# Patient Record
Sex: Male | Born: 1960 | Hispanic: No | Marital: Married | Smoking: Former smoker
Health system: Southern US, Community
[De-identification: ages and names within clinical notes are randomized; demographics above are authoritative.]

## PROBLEM LIST (undated history)

## (undated) DIAGNOSIS — E7800 Pure hypercholesterolemia, unspecified: Secondary | ICD-10-CM

## (undated) DIAGNOSIS — I10 Essential (primary) hypertension: Secondary | ICD-10-CM

---

## 2009-12-07 ENCOUNTER — Encounter: Admission: RE | Admit: 2009-12-07 | Discharge: 2009-12-07 | Payer: Self-pay | Admitting: Family Medicine

## 2011-01-16 ENCOUNTER — Other Ambulatory Visit: Payer: Self-pay

## 2011-01-16 ENCOUNTER — Emergency Department (HOSPITAL_COMMUNITY): Payer: BC Managed Care – PPO

## 2011-01-16 ENCOUNTER — Emergency Department (HOSPITAL_COMMUNITY): Payer: BC Managed Care – PPO | Admitting: Certified Registered Nurse Anesthetist

## 2011-01-16 ENCOUNTER — Encounter (HOSPITAL_COMMUNITY): Payer: Self-pay | Admitting: Certified Registered Nurse Anesthetist

## 2011-01-16 ENCOUNTER — Encounter: Payer: Self-pay | Admitting: Neurology

## 2011-01-16 ENCOUNTER — Encounter (HOSPITAL_COMMUNITY)
Admission: EM | Disposition: A | Discharge status: Home / Self Care | Payer: Self-pay | Source: Ambulatory Visit | Attending: Orthopedic Surgery

## 2011-01-16 ENCOUNTER — Inpatient Hospital Stay (HOSPITAL_COMMUNITY)
Admission: EM | Admit: 2011-01-16 | Discharge: 2011-01-20 | DRG: 219 | Disposition: A | Discharge status: Home / Self Care | Payer: BC Managed Care – PPO | Source: Ambulatory Visit | Attending: Orthopedic Surgery | Admitting: Orthopedic Surgery

## 2011-01-16 DIAGNOSIS — I10 Essential (primary) hypertension: Secondary | ICD-10-CM | POA: Diagnosis present

## 2011-01-16 DIAGNOSIS — Y990 Civilian activity done for income or pay: Secondary | ICD-10-CM

## 2011-01-16 DIAGNOSIS — E7800 Pure hypercholesterolemia, unspecified: Secondary | ICD-10-CM | POA: Diagnosis present

## 2011-01-16 DIAGNOSIS — S82843B Displaced bimalleolar fracture of unspecified lower leg, initial encounter for open fracture type I or II: Principal | ICD-10-CM | POA: Diagnosis present

## 2011-01-16 DIAGNOSIS — K219 Gastro-esophageal reflux disease without esophagitis: Secondary | ICD-10-CM | POA: Diagnosis present

## 2011-01-16 DIAGNOSIS — S82209B Unspecified fracture of shaft of unspecified tibia, initial encounter for open fracture type I or II: Secondary | ICD-10-CM | POA: Diagnosis present

## 2011-01-16 DIAGNOSIS — W1789XA Other fall from one level to another, initial encounter: Secondary | ICD-10-CM | POA: Diagnosis present

## 2011-01-16 HISTORY — DX: Essential (primary) hypertension: I10

## 2011-01-16 HISTORY — PX: ORIF ANKLE FRACTURE: SHX5408

## 2011-01-16 HISTORY — DX: Pure hypercholesterolemia, unspecified: E78.00

## 2011-01-16 HISTORY — PX: FRACTURE SURGERY: SHX138

## 2011-01-16 LAB — PROTIME-INR
INR: 0.97 (ref 0.00–1.49)
Prothrombin Time: 13.1 seconds (ref 11.6–15.2)

## 2011-01-16 LAB — DIFFERENTIAL
Basophils Absolute: 0.1 10*3/uL (ref 0.0–0.1)
Lymphocytes Relative: 30 % (ref 12–46)
Lymphs Abs: 3.8 10*3/uL (ref 0.7–4.0)
Neutro Abs: 8 10*3/uL — ABNORMAL HIGH (ref 1.7–7.7)

## 2011-01-16 LAB — CBC
Platelets: 210 10*3/uL (ref 150–400)
RBC: 5.17 MIL/uL (ref 4.22–5.81)
RDW: 13.3 % (ref 11.5–15.5)
WBC: 12.8 10*3/uL — ABNORMAL HIGH (ref 4.0–10.5)

## 2011-01-16 LAB — BASIC METABOLIC PANEL
CO2: 19 mEq/L (ref 19–32)
Chloride: 100 mEq/L (ref 96–112)
Potassium: 3.3 mEq/L — ABNORMAL LOW (ref 3.5–5.1)
Sodium: 136 mEq/L (ref 135–145)

## 2011-01-16 SURGERY — OPEN REDUCTION INTERNAL FIXATION (ORIF) ANKLE FRACTURE
Anesthesia: General | Laterality: Right | Wound class: Contaminated

## 2011-01-16 MED ORDER — SODIUM CHLORIDE 0.9 % IV SOLN
INTRAVENOUS | Status: DC | PRN
  Administered 2011-01-16: 12:00:00 via INTRAVENOUS

## 2011-01-16 MED ORDER — DIPHENHYDRAMINE HCL 50 MG/ML IJ SOLN
12.5000 mg | Freq: Four times a day (QID) | INTRAMUSCULAR | Status: DC | PRN
  Administered 2011-01-17: 12.5 mg via INTRAVENOUS
  Filled 2011-01-16: qty 1

## 2011-01-16 MED ORDER — PROPOFOL 10 MG/ML IV EMUL
INTRAVENOUS | Status: DC | PRN
  Administered 2011-01-16: 280 mg via INTRAVENOUS

## 2011-01-16 MED ORDER — METHOCARBAMOL 100 MG/ML IJ SOLN: 500.0000 mg | Freq: Four times a day (QID) | INTRAMUSCULAR | Status: DC | PRN

## 2011-01-16 MED ORDER — MORPHINE SULFATE 10 MG/ML IJ SOLN
INTRAMUSCULAR | Status: DC | PRN
  Administered 2011-01-16 (×2): 2 mg via INTRAVENOUS
  Administered 2011-01-16 (×2): 3 mg via INTRAVENOUS

## 2011-01-16 MED ORDER — MIDAZOLAM HCL 5 MG/5ML IJ SOLN
INTRAMUSCULAR | Status: DC | PRN
  Administered 2011-01-16: 2 mg via INTRAVENOUS

## 2011-01-16 MED ORDER — SODIUM CHLORIDE 0.9 % IR SOLN
Status: DC | PRN
  Administered 2011-01-16: 3000 mL

## 2011-01-16 MED ORDER — WARFARIN VIDEO: Freq: Once | Status: DC

## 2011-01-16 MED ORDER — FENTANYL CITRATE 0.05 MG/ML IJ SOLN
INTRAMUSCULAR | Status: DC | PRN
  Administered 2011-01-16 (×2): 50 ug via INTRAVENOUS
  Administered 2011-01-16: 100 ug via INTRAVENOUS
  Administered 2011-01-16: 50 ug via INTRAVENOUS
  Administered 2011-01-16: 100 ug via INTRAVENOUS
  Administered 2011-01-16: 50 ug via INTRAVENOUS
  Administered 2011-01-16: 100 ug via INTRAVENOUS

## 2011-01-16 MED ORDER — LACTATED RINGERS IV SOLN
INTRAVENOUS | Status: DC | PRN
  Administered 2011-01-16 (×3): via INTRAVENOUS

## 2011-01-16 MED ORDER — VECURONIUM BROMIDE 10 MG IV SOLR
INTRAVENOUS | Status: DC | PRN
  Administered 2011-01-16 (×2): 2 mg via INTRAVENOUS

## 2011-01-16 MED ORDER — DIPHENHYDRAMINE HCL 12.5 MG/5ML PO ELIX
12.5000 mg | ORAL_SOLUTION | Freq: Four times a day (QID) | ORAL | Status: DC | PRN
  Filled 2011-01-16: qty 5

## 2011-01-16 MED ORDER — GLYCOPYRROLATE 0.2 MG/ML IJ SOLN
INTRAMUSCULAR | Status: DC | PRN
  Administered 2011-01-16: .4 mg via INTRAVENOUS

## 2011-01-16 MED ORDER — DOCUSATE SODIUM 100 MG PO CAPS
100.0000 mg | ORAL_CAPSULE | Freq: Two times a day (BID) | ORAL | Status: DC
  Administered 2011-01-16 – 2011-01-20 (×8): 100 mg via ORAL
  Filled 2011-01-16 (×11): qty 1

## 2011-01-16 MED ORDER — PATIENT'S GUIDE TO USING COUMADIN BOOK
Freq: Once | Status: AC
  Administered 2011-01-16: 18:00:00
  Filled 2011-01-16: qty 1

## 2011-01-16 MED ORDER — ONDANSETRON HCL 4 MG/2ML IJ SOLN
4.0000 mg | Freq: Four times a day (QID) | INTRAMUSCULAR | Status: DC | PRN
  Filled 2011-01-16 (×3): qty 2

## 2011-01-16 MED ORDER — ONDANSETRON HCL 4 MG/2ML IJ SOLN
4.0000 mg | Freq: Four times a day (QID) | INTRAMUSCULAR | Status: DC | PRN
  Administered 2011-01-16 – 2011-01-18 (×4): 4 mg via INTRAVENOUS
  Filled 2011-01-16: qty 2

## 2011-01-16 MED ORDER — MAGNESIUM CITRATE PO SOLN
1.0000 | Freq: Once | ORAL | Status: AC | PRN
  Filled 2011-01-16: qty 296

## 2011-01-16 MED ORDER — SENNOSIDES-DOCUSATE SODIUM 8.6-50 MG PO TABS
1.0000 | ORAL_TABLET | Freq: Every evening | ORAL | Status: DC | PRN
  Administered 2011-01-19: 1 via ORAL
  Filled 2011-01-16: qty 1

## 2011-01-16 MED ORDER — ROCURONIUM BROMIDE 100 MG/10ML IV SOLN
INTRAVENOUS | Status: DC | PRN
  Administered 2011-01-16: 50 mg via INTRAVENOUS

## 2011-01-16 MED ORDER — ACETAMINOPHEN 10 MG/ML IV SOLN
INTRAVENOUS | Status: DC | PRN
  Administered 2011-01-16: 1000 mg via INTRAVENOUS

## 2011-01-16 MED ORDER — CEFAZOLIN SODIUM 1-5 GM-% IV SOLN
1.0000 g | Freq: Once | INTRAVENOUS | Status: AC
  Administered 2011-01-16 (×2): 1 g via INTRAVENOUS
  Filled 2011-01-16: qty 50

## 2011-01-16 MED ORDER — METOCLOPRAMIDE HCL 10 MG PO TABS: 5.0000 mg | ORAL_TABLET | Freq: Three times a day (TID) | ORAL | Status: DC | PRN

## 2011-01-16 MED ORDER — SODIUM CHLORIDE 0.9 % IV SOLN
INTRAVENOUS | Status: DC
  Administered 2011-01-17: 1000 mL via INTRAVENOUS
  Administered 2011-01-17: 20 mL/h via INTRAVENOUS

## 2011-01-16 MED ORDER — NEOSTIGMINE METHYLSULFATE 1 MG/ML IJ SOLN
INTRAMUSCULAR | Status: DC | PRN
  Administered 2011-01-16: 3 mg via INTRAVENOUS

## 2011-01-16 MED ORDER — SODIUM CHLORIDE 0.9 % IV SOLN
INTRAVENOUS | Status: DC
  Administered 2011-01-16: 12:00:00 via INTRAVENOUS
  Administered 2011-01-16: 1000 mL via INTRAVENOUS

## 2011-01-16 MED ORDER — ONDANSETRON HCL 4 MG/2ML IJ SOLN
INTRAMUSCULAR | Status: DC | PRN
  Administered 2011-01-16: 4 mg via INTRAVENOUS

## 2011-01-16 MED ORDER — ONDANSETRON HCL 4 MG/2ML IJ SOLN: 4.0000 mg | Freq: Once | INTRAMUSCULAR | Status: DC | PRN

## 2011-01-16 MED ORDER — HYDROMORPHONE HCL PF 1 MG/ML IJ SOLN
0.2500 mg | INTRAMUSCULAR | Status: DC | PRN
  Administered 2011-01-16 (×2): 0.5 mg via INTRAVENOUS

## 2011-01-16 MED ORDER — LIDOCAINE HCL (CARDIAC) 20 MG/ML IV SOLN
INTRAVENOUS | Status: DC | PRN
  Administered 2011-01-16: 100 mg via INTRAVENOUS

## 2011-01-16 MED ORDER — HYDROMORPHONE 0.3 MG/ML IV SOLN
INTRAVENOUS | Status: DC
  Administered 2011-01-16: 4.5 mg via INTRAVENOUS
  Administered 2011-01-17: 02:00:00 via INTRAVENOUS
  Administered 2011-01-17: 6.51 mg via INTRAVENOUS
  Filled 2011-01-16 (×2): qty 25

## 2011-01-16 MED ORDER — SIMVASTATIN 40 MG PO TABS
40.0000 mg | ORAL_TABLET | Freq: Every day | ORAL | Status: DC
  Administered 2011-01-17 – 2011-01-19 (×3): 40 mg via ORAL
  Filled 2011-01-16 (×4): qty 1

## 2011-01-16 MED ORDER — TETANUS-DIPHTH-ACELL PERTUSSIS 5-2.5-18.5 LF-MCG/0.5 IM SUSP
0.5000 mL | Freq: Once | INTRAMUSCULAR | Status: AC
  Administered 2011-01-16: 0.5 mL via INTRAMUSCULAR
  Filled 2011-01-16 (×2): qty 0.5

## 2011-01-16 MED ORDER — ONDANSETRON HCL 4 MG PO TABS: 4.0000 mg | ORAL_TABLET | Freq: Four times a day (QID) | ORAL | Status: DC | PRN

## 2011-01-16 MED ORDER — HYDROCODONE-ACETAMINOPHEN 5-325 MG PO TABS
1.0000 | ORAL_TABLET | ORAL | Status: DC | PRN
  Administered 2011-01-17 – 2011-01-18 (×3): 2 via ORAL
  Filled 2011-01-16 (×3): qty 2

## 2011-01-16 MED ORDER — LACTATED RINGERS IV SOLN
INTRAVENOUS | Status: DC | PRN
  Administered 2011-01-16: 14:00:00 via INTRAVENOUS

## 2011-01-16 MED ORDER — OXYCODONE-ACETAMINOPHEN 5-325 MG PO TABS
1.0000 | ORAL_TABLET | ORAL | Status: DC | PRN
  Administered 2011-01-17 – 2011-01-20 (×11): 2 via ORAL
  Filled 2011-01-16 (×10): qty 2
  Filled 2011-01-16: qty 1
  Filled 2011-01-16 (×2): qty 2

## 2011-01-16 MED ORDER — SODIUM CHLORIDE 0.9 % IJ SOLN: 9.0000 mL | INTRAMUSCULAR | Status: DC | PRN

## 2011-01-16 MED ORDER — NALOXONE HCL 0.4 MG/ML IJ SOLN: 0.4000 mg | INTRAMUSCULAR | Status: DC | PRN

## 2011-01-16 MED ORDER — PANTOPRAZOLE SODIUM 20 MG PO TBEC
20.0000 mg | DELAYED_RELEASE_TABLET | Freq: Every day | ORAL | Status: DC
  Administered 2011-01-16 – 2011-01-20 (×5): 20 mg via ORAL
  Filled 2011-01-16 (×5): qty 1

## 2011-01-16 MED ORDER — GENTAMICIN SULFATE 40 MG/ML IJ SOLN
120.0000 mg | INTRAVENOUS | Status: DC
  Filled 2011-01-16: qty 3

## 2011-01-16 MED ORDER — BISACODYL 5 MG PO TBEC: 5.0000 mg | DELAYED_RELEASE_TABLET | Freq: Every day | ORAL | Status: DC | PRN

## 2011-01-16 MED ORDER — DIPHENHYDRAMINE HCL 12.5 MG/5ML PO ELIX
12.5000 mg | ORAL_SOLUTION | ORAL | Status: DC | PRN
  Filled 2011-01-16: qty 10

## 2011-01-16 MED ORDER — CEFAZOLIN SODIUM 1-5 GM-% IV SOLN
1.0000 g | Freq: Four times a day (QID) | INTRAVENOUS | Status: AC
  Administered 2011-01-16 – 2011-01-18 (×9): 1 g via INTRAVENOUS
  Filled 2011-01-16 (×9): qty 50

## 2011-01-16 MED ORDER — LABETALOL HCL 5 MG/ML IV SOLN
INTRAVENOUS | Status: DC | PRN
  Administered 2011-01-16: 5 mg via INTRAVENOUS

## 2011-01-16 MED ORDER — METOCLOPRAMIDE HCL 5 MG/ML IJ SOLN
5.0000 mg | Freq: Three times a day (TID) | INTRAMUSCULAR | Status: DC | PRN
  Filled 2011-01-16: qty 2

## 2011-01-16 MED ORDER — GENTAMICIN IN SALINE 1.6-0.9 MG/ML-% IV SOLN
INTRAVENOUS | Status: DC | PRN
  Administered 2011-01-16: 120 mg via INTRAVENOUS

## 2011-01-16 MED ORDER — HYDROMORPHONE HCL PF 1 MG/ML IJ SOLN
1.0000 mg | Freq: Once | INTRAMUSCULAR | Status: AC
  Administered 2011-01-16: 1 mg via INTRAVENOUS
  Filled 2011-01-16: qty 1

## 2011-01-16 MED ORDER — METHOCARBAMOL 500 MG PO TABS
500.0000 mg | ORAL_TABLET | Freq: Four times a day (QID) | ORAL | Status: DC | PRN
  Administered 2011-01-17 – 2011-01-19 (×7): 500 mg via ORAL
  Filled 2011-01-16 (×8): qty 1

## 2011-01-16 MED ORDER — WARFARIN SODIUM 10 MG PO TABS
10.0000 mg | ORAL_TABLET | Freq: Once | ORAL | Status: AC
  Administered 2011-01-16: 10 mg via ORAL
  Filled 2011-01-16: qty 1

## 2011-01-16 SURGICAL SUPPLY — 73 items
BANDAGE ELASTIC 6 VELCRO ST LF (GAUZE/BANDAGES/DRESSINGS) ×2 IMPLANT
BANDAGE ESMARK 6X9 LF (GAUZE/BANDAGES/DRESSINGS) IMPLANT
BANDAGE GAUZE ELAST BULKY 4 IN (GAUZE/BANDAGES/DRESSINGS) ×2 IMPLANT
BIT DRILL 2.5X2.75 QC CALB (BIT) ×2 IMPLANT
BIT DRILL CALIBRATED 2.7 (BIT) ×2 IMPLANT
BLADE SURG 10 STRL SS (BLADE) ×2 IMPLANT
BNDG COHESIVE 4X5 TAN STRL (GAUZE/BANDAGES/DRESSINGS) ×2 IMPLANT
BNDG COHESIVE 6X5 TAN STRL LF (GAUZE/BANDAGES/DRESSINGS) ×2 IMPLANT
BNDG ESMARK 6X9 LF (GAUZE/BANDAGES/DRESSINGS)
BNDG GAUZE STRTCH 6 (GAUZE/BANDAGES/DRESSINGS) ×2 IMPLANT
CANNISTER 500ML ×2 IMPLANT
CLOTH BEACON ORANGE TIMEOUT ST (SAFETY) ×2 IMPLANT
COVER SURGICAL LIGHT HANDLE (MISCELLANEOUS) ×4 IMPLANT
CUFF TOURNIQUET SINGLE 34IN LL (TOURNIQUET CUFF) IMPLANT
CUFF TOURNIQUET SINGLE 44IN (TOURNIQUET CUFF) IMPLANT
DRAPE C-ARM MINI 42X72 WSTRAPS (DRAPES) ×2 IMPLANT
DRAPE INCISE IOBAN 66X45 STRL (DRAPES) ×2 IMPLANT
DRAPE PROXIMA HALF (DRAPES) ×2 IMPLANT
DRAPE U-SHAPE 47X51 STRL (DRAPES) ×2 IMPLANT
DRSG ADAPTIC 3X8 NADH LF (GAUZE/BANDAGES/DRESSINGS) ×2 IMPLANT
DRSG VAC ATS SM SENSATRAC (GAUZE/BANDAGES/DRESSINGS) ×2 IMPLANT
DURAPREP 26ML APPLICATOR (WOUND CARE) ×2 IMPLANT
ELECT REM PT RETURN 9FT ADLT (ELECTROSURGICAL) ×2
ELECTRODE REM PT RTRN 9FT ADLT (ELECTROSURGICAL) ×1 IMPLANT
GLOVE BIOGEL PI IND STRL 9 (GLOVE) ×1 IMPLANT
GLOVE BIOGEL PI INDICATOR 9 (GLOVE) ×1
GLOVE SURG ORTHO 9.0 STRL STRW (GLOVE) ×2 IMPLANT
GOWN PREVENTION PLUS XLARGE (GOWN DISPOSABLE) ×2 IMPLANT
GOWN SRG XL XLNG 56XLVL 4 (GOWN DISPOSABLE) ×2 IMPLANT
GOWN STRL NON-REIN XL XLG LVL4 (GOWN DISPOSABLE) ×2
HANDPIECE INTERPULSE COAX TIP (DISPOSABLE) ×1
K-WIRE ACE 1.6X6 (WIRE) ×4
KIT BASIN OR (CUSTOM PROCEDURE TRAY) ×2 IMPLANT
KIT ROOM TURNOVER OR (KITS) ×2 IMPLANT
KWIRE ACE 1.6X6 (WIRE) ×2 IMPLANT
MANIFOLD NEPTUNE II (INSTRUMENTS) ×2 IMPLANT
NS IRRIG 1000ML POUR BTL (IV SOLUTION) ×2 IMPLANT
PACK ORTHO EXTREMITY (CUSTOM PROCEDURE TRAY) ×2 IMPLANT
PAD ARMBOARD 7.5X6 YLW CONV (MISCELLANEOUS) ×4 IMPLANT
PAD CAST 4YDX4 CTTN HI CHSV (CAST SUPPLIES) ×1 IMPLANT
PADDING CAST COTTON 4X4 STRL (CAST SUPPLIES) ×1
PADDING CAST COTTON 6X4 STRL (CAST SUPPLIES) ×2 IMPLANT
PENCIL BUTTON HOLSTER BLD 10FT (ELECTRODE) ×2 IMPLANT
PLATE 6H RT DIST ANTLAT TIB (Plate) ×1 IMPLANT
PLATE ACE 100DEG 8HOLE (Plate) ×2 IMPLANT
PLATE ANTLAT CNTR NAR 114X6 (Plate) ×1 IMPLANT
SCREW CORT 3.5X26 (Screw) ×1 IMPLANT
SCREW CORT FT 32X3.5XNONLOCK (Screw) ×1 IMPLANT
SCREW CORT T15 26X3.5XST LCK (Screw) ×1 IMPLANT
SCREW CORTICAL 3.5MM  12MM (Screw) ×4 IMPLANT
SCREW CORTICAL 3.5MM  28MM (Screw) ×1 IMPLANT
SCREW CORTICAL 3.5MM  30MM (Screw) ×1 IMPLANT
SCREW CORTICAL 3.5MM  32MM (Screw) ×1 IMPLANT
SCREW CORTICAL 3.5MM 12MM (Screw) ×4 IMPLANT
SCREW CORTICAL 3.5MM 14MM (Screw) ×4 IMPLANT
SCREW CORTICAL 3.5MM 28MM (Screw) ×1 IMPLANT
SCREW CORTICAL 3.5MM 30MM (Screw) ×1 IMPLANT
SCREW LOCK CORT STAR 3.5X38 (Screw) ×2 IMPLANT
SCREW LOCK CORT STAR 3.5X42 (Screw) ×6 IMPLANT
SET HNDPC FAN SPRY TIP SCT (DISPOSABLE) ×1 IMPLANT
SPONGE GAUZE 4X4 12PLY (GAUZE/BANDAGES/DRESSINGS) ×2 IMPLANT
SPONGE LAP 18X18 X RAY DECT (DISPOSABLE) ×2 IMPLANT
STAPLER VISISTAT 35W (STAPLE) ×2 IMPLANT
STOCKINETTE IMPERVIOUS LG (DRAPES) ×2 IMPLANT
SUCTION FRAZIER TIP 10 FR DISP (SUCTIONS) ×2 IMPLANT
SUT ETHILON 2 0 PSLX (SUTURE) IMPLANT
SUT VIC AB 0 CTB1 27 (SUTURE) ×2 IMPLANT
SUT VIC AB 2-0 CTB1 (SUTURE) ×4 IMPLANT
SYR BULB IRRIGATION 50ML (SYRINGE) ×2 IMPLANT
TOWEL OR 17X24 6PK STRL BLUE (TOWEL DISPOSABLE) ×2 IMPLANT
TOWEL OR 17X26 10 PK STRL BLUE (TOWEL DISPOSABLE) ×2 IMPLANT
TUBE CONNECTING 12X1/4 (SUCTIONS) ×2 IMPLANT
WATER STERILE IRR 1000ML POUR (IV SOLUTION) ×2 IMPLANT

## 2011-01-16 NOTE — ED Notes (Signed)
All pt belongings placed in bag. Some money and coins fell out of pocket, placed in blue cup put in pt belonging bag

## 2011-01-16 NOTE — Transfer of Care (Addendum)
Immediate Anesthesia Transfer of Care Note  Patient: Xavier [name]  Procedure(s) Performed:  OPEN REDUCTION INTERNAL FIXATION (ORIF) ANKLE FRACTURE - open reduction and internal fixation tib/fib fracture  Patient Location: PACU  Anesthesia Type: General  Level of Consciousness: awake  Airway & Oxygen Therapy: Patient Spontanous Breathing and Patient connected to nasal cannula oxygen  Post-op Assessment: Report given to PACU RN and Post -op Vital signs reviewed and stable  Post vital signs: stable  Complications: No apparent anesthesia complications

## 2011-01-16 NOTE — Addendum Note (Signed)
Addendum  created 01/16/11 1549 by Rivka Barbara, MD   Modules edited:Anesthesia Events

## 2011-01-16 NOTE — Anesthesia Postprocedure Evaluation (Signed)
  Anesthesia Post-op Note  Patient: Xavier [name]  Procedure(s) Performed:  OPEN REDUCTION INTERNAL FIXATION (ORIF) ANKLE FRACTURE - open reduction and internal fixation tib/fib fracture  Patient Location: PACU  Anesthesia Type: General  Level of Consciousness: awake, sedated and patient cooperative  Airway and Oxygen Therapy: Patient Spontanous Breathing and Patient connected to nasal cannula oxygen  Post-op Pain: mild  Post-op Assessment: Post-op Vital signs reviewed, Patient's Cardiovascular Status Stable, PATIENT'S CARDIOVASCULAR STATUS UNSTABLE, Patent Airway, No signs of Nausea or vomiting and Pain level controlled  Post-op Vital Signs: stable  Complications: No apparent anesthesia complications

## 2011-01-16 NOTE — ED Notes (Signed)
PER PTAR-Pt was standing on ladder on 2 step, fell, got right foot caught in ladder. Right leg wound is open, leg stabilized with splint. Pt fully immobilized even though pt not c/o neck or back pain. No LOC. Pain 10/10. Bandage applied. R. Pedal pulse strong, able to wiggle toes. 116/80, 68, 20 RR, 100 RA.

## 2011-01-16 NOTE — Anesthesia Preprocedure Evaluation (Addendum)
Anesthesia Evaluation  Patient identified by MRN, date of birth, ID band Patient awake    Reviewed: Allergy & Precautions, H&P , NPO status , Patient's Chart, lab work & pertinent test results  Airway Mallampati: II TM Distance: >3 FB Neck ROM: Limited   Comment: C-spine not cleared patient in cervical collar Dental  (+) Teeth Intact and Dental Advisory Given   Pulmonary neg pulmonary ROS,    Pulmonary exam normal       Cardiovascular hypertension,     Neuro/Psych Negative Neurological ROS     GI/Hepatic Neg liver ROS, GERD-  Medicated and Controlled,  Endo/Other  Negative Endocrine ROS  Renal/GU negative Renal ROS  Genitourinary negative   Musculoskeletal   Abdominal Normal abdominal exam  (+)   Peds  Hematology negative hematology ROS (+)   Anesthesia Other Findings   Reproductive/Obstetrics negative OB ROS                          Anesthesia Physical Anesthesia Plan  ASA: I  Anesthesia Plan: General and General ETT   Post-op Pain Management:    Induction: Intravenous  Airway Management Planned: Oral ETT  Additional Equipment:   Intra-op Plan:   Post-operative Plan: Extubation in OR  Informed Consent: I have reviewed the patients History and Physical, chart, labs and discussed the procedure including the risks, benefits and alternatives for the proposed anesthesia with the patient or authorized representative who has indicated his/her understanding and acceptance.     Plan Discussed with: Anesthesiologist, CRNA and Surgeon  Anesthesia Plan Comments:         Anesthesia Quick Evaluation

## 2011-01-16 NOTE — ED Notes (Signed)
OR called ready for patient. Dr. Lajoyce Corners doing surgery.

## 2011-01-16 NOTE — Op Note (Signed)
OPERATIVE REPORT  DATE OF SURGERY: 01/16/2011  PATIENT:  Xavier [name],  50 y.o. male  PRE-OPERATIVE DIAGNOSIS:  open tibia fibula fracture. Plafond fracture. Open traumatic wound.  POST-OPERATIVE DIAGNOSIS:  open tibia fibula fracture. Plafond fracture. Open traumatic wound.  PROCEDURE:  Procedure(s): #1 irrigation and debridement open traumatic wound medial malleolus. #2 open reduction internal fixation plafond fracture of the tibia. #3 open reduction internal fixation fibular fracture. #4 closure of traumatic wound. #5 application of wound VAC.   SURGEON:  Surgeon(s): Nadara Mustard, MD  ANESTHESIA:   general  EBL:  Minimal ML  SPECIMEN:  No Specimen  TOURNIQUET:  * Missing tourniquet times found for documented tourniquets in log:  [id number] * Esmarch at the ankle for approximately 45 minutes. PROCEDURE DETAILS: Patient is a 50 year old gentleman who fell approximately 2 feet off a ladder. He had his ankle catching Her and had a twisting fall. Patient denied any head trauma or any other injury other than his ankle. Patient's foot was neurovascularly intact gross sensation was intact he had good pulses and he presents at this time for surgical intervention. Risks and benefits were discussed including infection neurovascular injury persistent pain DVT arthritis need for additional surgery. Patient states he understands and wishes to proceed at this time. Patient was brought to the OR room 15 and underwent a general anesthetic. After adequate levels of anesthesia were obtained patient's right lower extremity was first prepped using Hibiclens scrub and then prepped using DuraPrep and draped into a sterile field. Attention was first focused on the open wound. Using a curette and pulsatile lavage the open wound was irrigated and cleansed. There is no gross foreign bodies within the wound. Attention was then focused anterior collateral. A anterior lateral incision was made in order to provide  fixation to the fibula and tibia. Due to the attending was skin over the medial malleolus it was not felt to be safe to proceed with placing a plate the medial aspect of the tibia. The anterior lateral incision was carried down to the tibia the tibia was reduced and stabilized with a Biomet anterior lateral plate. 3 distal locking screws were placed in for proximal compression screws were placed. C-arm fluoroscopy verified reduction. Attention was then focused on the fibula. There was significant comminution of the fibula the fibula was pulled out to length and stabilized with an 8 hole one third tubular plate with 3 screws proximally and 3 screws distally. C-arm fluoroscopy verified congruence of the mortise and verified reduction in both AP and lateral planes. The wounds were again irrigated with pulsatile lavage the skin over the traumatic wound was closed using a far near near far suture technique. The skin over the anterior lateral incision was closed using #0 intracuticular stitch 0 Vicryl and approximate staples. There was no tension on the medial traumatic wound and slight amount of tension on the lateral incision. It was elected to apply a wound VAC to help with the wound healing. Wound VAC was applied over both wounds. This was covered with Coban a good suction fit was obtained patient was extubated taken to the PACU in stable condition.  PLAN OF CARE: Admit to inpatient   PATIENT DISPOSITION:  PACU - hemodynamically stable.   Nadara Mustard, MD 01/16/2011 2:51 PM

## 2011-01-16 NOTE — Addendum Note (Signed)
Addendum  created 01/16/11 1619 by Rivka Barbara, MD   Modules edited:Anesthesia Events

## 2011-01-16 NOTE — ED Notes (Signed)
All belongings given to pt girlfriend

## 2011-01-16 NOTE — Addendum Note (Signed)
Addendum  created 01/16/11 1555 by Rivka Barbara, MD   Modules edited:Anesthesia Events, Notes Section

## 2011-01-16 NOTE — H&P (Signed)
Xavier [name] is an 50 y.o. male.   Chief Complaint: Right ankle pain with bone sticking out of the skin. 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 Patient states he only fell several feet but sustaining injury due to getting his ankles stuck in the rungs of the ladder. Patient denies any other trauma other than his ankle. Denies any head neck back pelvis or upper extremity pain.  Past Medical History  Diagnosis Date  . Hypertension   . Hypercholesteremia     History reviewed. No pertinent past surgical history.  No family history on file. Social History:  reports that he has never smoked. He has quit using smokeless tobacco. He reports that he drinks alcohol. He reports that he does not use illicit drugs.  Allergies: No Known Allergies  Medications Prior to Admission  Medication Dose Route Frequency Provider Last Rate Last Dose  . 0.9 %  sodium chloride infusion   Intravenous Continuous Celene Kras, MD 125 mL/hr at 01/16/11 1134    . ceFAZolin (ANCEF) IVPB 1 g/50 mL premix  1 g Intravenous Once Celene Kras, MD   1 g at 01/16/11 1140  . gentamicin (GARAMYCIN) 120 mg in dextrose 5 % 50 mL IVPB  120 mg Intravenous To OR Nadara Mustard, MD      . HYDROmorphone (DILAUDID) injection 1 mg  1 mg Intravenous Once Celene Kras, MD   1 mg at 01/16/11 1118  . HYDROmorphone (DILAUDID) injection 1 mg  1 mg Intravenous Once Celene Kras, MD   1 mg at 01/16/11 1133  . TDaP (BOOSTRIX) injection 0.5 mL  0.5 mL Intramuscular Once Celene Kras, MD   0.5 mL at 01/16/11 1135   No current outpatient prescriptions on file as of 01/16/2011.    Results for orders placed during the hospital encounter of 01/16/11 (from the past 48 hour(s))  CBC     Status: Abnormal   Collection Time   01/16/11 11:35 AM      Component Value Range Comment   WBC 12.8 (*) 4.0 -  10.5 (K/uL)    RBC 5.17  4.22 - 5.81 (MIL/uL)    Hemoglobin 15.9  13.0 - 17.0 (g/dL)    HCT 54.0  98.1 - 19.1 (%)    MCV 84.3  78.0 - 100.0 (fL)    MCH 30.8  26.0 - 34.0 (pg)    MCHC 36.5 (*) 30.0 - 36.0 (g/dL)    RDW 47.8  29.5 - 62.1 (%)    Platelets 210  150 - 400 (K/uL)   DIFFERENTIAL     Status: Abnormal   Collection Time   01/16/11 11:35 AM      Component Value Range Comment   Neutrophils Relative 62  43 - 77 (%)    Neutro Abs 8.0 (*) 1.7 - 7.7 (K/uL)    Lymphocytes Relative 30  12 - 46 (%)    Lymphs Abs 3.8  0.7 - 4.0 (K/uL)    Monocytes Relative 6  3 - 12 (%)    Monocytes Absolute 0.8  0.1 - 1.0 (K/uL)    Eosinophils Relative 2  0 - 5 (%)    Eosinophils Absolute 0.3  0.0 - 0.7 (K/uL)    Basophils Relative 0  0 - 1 (%)    Basophils  Absolute 0.1  0.0 - 0.1 (K/uL)   BASIC METABOLIC PANEL     Status: Abnormal   Collection Time   01/16/11 11:35 AM      Component Value Range Comment   Sodium 136  135 - 145 (mEq/L)    Potassium 3.3 (*) 3.5 - 5.1 (mEq/L)    Chloride 100  96 - 112 (mEq/L)    CO2 19  19 - 32 (mEq/L)    Glucose, Bld 151 (*) 70 - 99 (mg/dL)    BUN 11  6 - 23 (mg/dL)    Creatinine, Ser 4.09  0.50 - 1.35 (mg/dL)    Calcium 9.4  8.4 - 10.5 (mg/dL)    GFR calc non Af Amer >90  >90 (mL/min)    GFR calc Af Amer >90  >90 (mL/min)   PROTIME-INR     Status: Normal   Collection Time   01/16/11 11:35 AM      Component Value Range Comment   Prothrombin Time 13.1  11.6 - 15.2 (seconds)    INR 0.97  0.00 - 1.49     Dg Ankle Right Port  01/16/2011  *RADIOLOGY REPORT*  Clinical Data: Open ankle fracture.  PORTABLE RIGHT ANKLE - 2 VIEW  Comparison: None.  Findings: Markedly comminuted distal right tibial and fibular fractures.  The distal tibial shaft appears to disrupt the skin surface.  The tibial shaft is displaced 2.6 cm medially relative to the distal fragment.  IMPRESSION: Comminuted, displaced open distal tibial and fibular fractures.  Original Report Authenticated  By: Cyndie Chime, M.D.    Review of Systems  All other systems reviewed and are negative.    Blood pressure 144/75, pulse 69, temperature 97.7 F (36.5 C), temperature source Oral, resp. rate 19, height 6' (1.829 m), weight 95.255 kg (210 lb), SpO2 100.00%. Physical Exam  On examination patient has an open type IIIA distal tibia and fibular fracture right ankle. He has good dorsalis pedis pulse. His foot is neurovascularly intact and has gross sensation intact. Assessment/Plan Open type IIIA distal tibia and fibular fracture with a pilon fracture of the tibia and a segmental fracture of the fibula. Plan will plan for irrigation and debridement as well as open reduction internal fixation of the P1 fracture and open reduction internal fixation of the segmental fibular fracture. Risks and benefits were discussed including infection neurovascular injury pulmonary embolus DVT arthritis need for additional surgery patient states he understands and wishes to proceed at this time.  Akiera Allbaugh V 01/16/2011, 12:30 PM

## 2011-01-16 NOTE — Anesthesia Procedure Notes (Addendum)
Procedure Name: Intubation Date/Time: 01/16/2011 12:56 PM Performed by: Romie Minus Pre-anesthesia Checklist: Patient identified, Emergency Drugs available, Suction available and Patient being monitored Patient Re-evaluated:Patient Re-evaluated prior to inductionOxygen Delivery Method: Circle System Utilized Preoxygenation: Pre-oxygenation with 100% oxygen Intubation Type: IV induction Ventilation: Mask ventilation without difficulty and Oral airway inserted - appropriate to patient size Laryngoscope Size: Miller and 2 Grade View: Grade I Tube type: Oral Tube size: 7.5 mm Number of attempts: 1 Placement Confirmation: ETT inserted through vocal cords under direct vision,  positive ETCO2 and breath sounds checked- equal and bilateral Secured at: 23 cm Tube secured with: Tape Dental Injury: Teeth and Oropharynx as per pre-operative assessment

## 2011-01-16 NOTE — Progress Notes (Signed)
ANTICOAGULATION CONSULT NOTE - Initial Consult  Pharmacy Consult for warfarin Indication: VTE prophylaxis  No Known Allergies  Patient Measurements: Height: 6' (182.9 cm) Weight: 210 lb (95.255 kg) IBW/kg (Calculated) : 77.6   Vital Signs: Temp: 99 F (37.2 C) (12/24 1615) Temp src: Oral (12/24 1121) BP: 110/62 mmHg (12/24 1600) Pulse Rate: 79  (12/24 1615)  Labs:  Basename 01/16/11 1135  HGB 15.9  HCT 43.6  PLT 210  APTT --  LABPROT 13.1  INR 0.97  HEPARINUNFRC --  CREATININE 0.77  CKTOTAL --  CKMB --  TROPONINI --   Estimated Creatinine Clearance: 135.3 ml/min (by C-G formula based on Cr of 0.77).  Medical History: Past Medical History  Diagnosis Date  . Hypertension   . Hypercholesteremia     Medications:  Scheduled:    . ceFAZolin (ANCEF) IV  1 g Intravenous Once  . ceFAZolin (ANCEF) IV  1 g Intravenous Q6H  . docusate sodium  100 mg Oral BID  .  HYDROmorphone (DILAUDID) injection  1 mg Intravenous Once  .  HYDROmorphone (DILAUDID) injection  1 mg Intravenous Once  . HYDROmorphone PCA 0.3 mg/mL   Intravenous Q4H  . pantoprazole  20 mg Oral Q1200  . TDaP  0.5 mL Intramuscular Once  . 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 He is to be placed on warfarin for VTE prophylaxis s/p surgery. Baseline INR is 0.97 and CBC is WNL p/t surgery. Some blood loss is to be expected with next CBC reading. Goal of Therapy:  INR 2-3   Plan:  1) Warfarin 10 mg po x1 2) Daily INR 3) Coumadin education video/book  Katrinka Blazing [place] R 01/16/2011,4:53 PM

## 2011-01-16 NOTE — Preoperative (Addendum)
Beta Blockers   Reason not to administer Beta Blockers:Not Applicable 

## 2011-01-16 NOTE — Addendum Note (Signed)
Addendum  created 01/16/11 1549 by Louetta Hollingshead Edward Wanya Bangura, MD   Modules edited:Anesthesia Events    

## 2011-01-16 NOTE — ED Provider Notes (Addendum)
History     CSN: [id number]  Arrival date & time 01/16/11  1105   First MD Initiated Contact with Patient 01/16/11 1111      Chief Complaint  Patient presents with  . Leg Injury    (Consider location/radiation/quality/duration/timing/severity/associated sxs/prior treatment) HPI Patient presents to the emergency room after her a fall. Patient states she's maybe 2-3 feet on a ladder when he slipped and fell twisting his right ankle. Patient sustained an open wound with an obvious fracture of his right ankle. Patient was brought in by EMS after immobilization. Patient denies any loss of consciousness. He denies any neck back or abdominal pain. He's not had any chest pain or any trouble breathing. Patient states his right leg feels a little bit numb but he can still feel me touching him and he tends to wiggle his toes. The pain is very severe and it increases with movement. Past Medical History  Diagnosis Date  . Hypertension   . Hypercholesteremia     History reviewed. No pertinent past surgical history.  No family history on file.  History  Substance Use Topics  . Smoking status: Never Smoker   . Smokeless tobacco: Not on file  . Alcohol Use: Yes      Review of Systems  All other systems reviewed and are negative.    Allergies  Review of patient's allergies indicates no known allergies.  Home Medications  No current outpatient prescriptions on file.  BP 144/75  Pulse 69  Temp(Src) 97.7 F (36.5 C) (Oral)  Resp 19  Ht 6' (1.829 m)  Wt 210 lb (95.255 kg)  BMI 28.48 kg/m2  SpO2 100%  Physical Exam  Nursing note and vitals reviewed. Constitutional: He appears well-developed and well-nourished. No distress.  HENT:  Head: Normocephalic and atraumatic.  Right Ear: External ear normal.  Left Ear: External ear normal.  Eyes: Conjunctivae are normal. Right eye exhibits no discharge. Left eye exhibits no discharge. No scleral icterus.  Neck: Neck supple. No  tracheal deviation present.  Cardiovascular: Normal rate, regular rhythm and intact distal pulses.   Pulmonary/Chest: Effort normal and breath sounds normal. No stridor. No respiratory distress. He has no wheezes. He has no rales.  Abdominal: Soft. Bowel sounds are normal. He exhibits no distension. There is no tenderness. There is no rebound and no guarding.  Musculoskeletal: He exhibits tenderness. He exhibits no edema.       Entire spine nontender, no tenderness knee, open wounds medial aspect right ankle with possibly 1 inch portion of bone visible, and dorsalis pedis pulse strong, cap refill brisk  Neurological: He is alert. He has normal strength. No sensory deficit. Cranial nerve deficit:  no gross defecits noted. He exhibits normal muscle tone. He displays no seizure activity. Coordination normal.  Skin: Skin is warm and dry. No rash noted.  Psychiatric: He has a normal mood and affect.    ED Course  Procedures (including critical care time)  Date: 01/16/2011  Rate: 67  Rhythm: normal sinus rhythm  QRS Axis: normal  Intervals: normal  ST/T Wave abnormalities: normal  Conduction Disutrbances: Incomplete right bundle branch block  Narrative Interpretation:   Old EKG Reviewed: none available  Medications  ceFAZolin (ANCEF) IVPB 1 g/50 mL premix (1 g Intravenous Given 01/16/11 1140)  0.9 %  sodium chloride infusion (  Intravenous New Bag/Given 01/16/11 1134)  simvastatin (ZOCOR) 40 MG tablet (not administered)  lansoprazole (PREVACID) 30 MG capsule (not administered)  HYDROmorphone (DILAUDID) injection 1 mg (1  mg Intravenous Given 01/16/11 1118)  HYDROmorphone (DILAUDID) injection 1 mg (1 mg Intravenous Given 01/16/11 1133)  TDaP (BOOSTRIX) injection 0.5 mL (0.5 mL Intramuscular Given 01/16/11 1135)     Labs Reviewed  CBC - Abnormal; Notable for the following:    WBC 12.8 (*)    MCHC 36.5 (*)    All other components within normal limits  DIFFERENTIAL - Abnormal; Notable for  the following:    Neutro Abs 8.0 (*)    All other components within normal limits  PROTIME-INR  BASIC METABOLIC PANEL   Dg Ankle Right Port  01/16/2011  *RADIOLOGY REPORT*  Clinical Data: Open ankle fracture.  PORTABLE RIGHT ANKLE - 2 VIEW  Comparison: None.  Findings: Markedly comminuted distal right tibial and fibular fractures.  The distal tibial shaft appears to disrupt the skin surface.  The tibial shaft is displaced 2.6 cm medially relative to the distal fragment.  IMPRESSION: Comminuted, displaced open distal tibial and fibular fractures.  Original Report Authenticated By: Cyndie Chime, M.D.     Dx: Open distal tibia, fibula fracture   MDM  Pt has an open tib fib fracture.  Abx and tetanus have been administered.  I spoke with Dr Lajoyce Corners and he will be taken to the OR.         Celene Kras, MD 01/16/11 1206  Celene Kras, MD 01/16/11 [phone]

## 2011-01-16 NOTE — ED Notes (Signed)
Pt transferred to  OR.

## 2011-01-16 NOTE — ED Notes (Signed)
Flow called, notified pt will need medsurg bed.

## 2011-01-17 LAB — BASIC METABOLIC PANEL
Chloride: 100 mEq/L (ref 96–112)
Creatinine, Ser: 0.78 mg/dL (ref 0.50–1.35)
GFR calc Af Amer: >90 mL/min (ref >90)
GFR calc non Af Amer: >90 mL/min (ref >90)
Potassium: 3.7 mEq/L (ref 3.5–5.1)

## 2011-01-17 LAB — PROTIME-INR
INR: 1.07 (ref 0.00–1.49)
Prothrombin Time: 14.1 seconds (ref 11.6–15.2)

## 2011-01-17 MED ORDER — WARFARIN SODIUM 10 MG PO TABS
10.0000 mg | ORAL_TABLET | Freq: Once | ORAL | Status: AC
  Administered 2011-01-17: 10 mg via ORAL
  Filled 2011-01-17: qty 1

## 2011-01-17 MED ORDER — ZOLPIDEM TARTRATE 10 MG PO TABS
10.0000 mg | ORAL_TABLET | Freq: Every evening | ORAL | Status: DC | PRN
  Administered 2011-01-17 – 2011-01-19 (×3): 10 mg via ORAL
  Filled 2011-01-17 (×3): qty 1

## 2011-01-17 MED ORDER — MORPHINE SULFATE 2 MG/ML IJ SOLN
2.0000 mg | INTRAMUSCULAR | Status: DC | PRN
  Administered 2011-01-17 – 2011-01-18 (×6): 2 mg via INTRAVENOUS
  Filled 2011-01-17 (×6): qty 1

## 2011-01-17 NOTE — Progress Notes (Signed)
ANTICOAGULATION CONSULT NOTE - Follow up Consult  Pharmacy Consult for warfarin Indication: VTE prophylaxis  No Known Allergies  Patient Measurements: Height: 6' (182.9 cm) Weight: 210 lb (95.255 kg) IBW/kg (Calculated) : 77.6   Vital Signs: Temp: 98.4 F (36.9 C) (12/25 0821) Temp src: Axillary (12/25 0821) BP: 127/80 mmHg (12/25 0821) Pulse Rate: 83  (12/25 0821)  Labs:  Basename 01/17/11 0540 01/16/11 1135  HGB -- 15.9  HCT -- 43.6  PLT -- 210  APTT -- --  LABPROT 14.1 13.1  INR 1.07 0.97  HEPARINUNFRC -- --  CREATININE 0.78 0.77  CKTOTAL -- --  CKMB -- --  TROPONINI -- --   Estimated Creatinine Clearance: 135.3 ml/min (by C-G formula based on Cr of 0.78).  Medical History: Past Medical History  Diagnosis Date  . Hypertension   . Hypercholesteremia     Medications:  Scheduled:     . ceFAZolin (ANCEF) IV  1 g Intravenous Once  . ceFAZolin (ANCEF) IV  1 g Intravenous Q6H  . docusate sodium  100 mg Oral BID  .  HYDROmorphone (DILAUDID) injection  1 mg Intravenous Once  .  HYDROmorphone (DILAUDID) injection  1 mg Intravenous Once  . pantoprazole  20 mg Oral Q1200  . patient's guide to using coumadin book   Does not apply Once  . simvastatin  40 mg Oral QHS  . TDaP  0.5 mL Intramuscular Once  . warfarin  10 mg Oral ONCE-1800  . warfarin   Does not apply Once  . DISCONTD: gentamicin  120 mg Intravenous To OR  . DISCONTD: HYDROmorphone PCA 0.3 mg/mL   Intravenous Q4H    Pharmacist System-Based Medication Review: Anticoagulation: Coumadin, INR subtherapeutic today, received 10 mg x1 yesterday Infectious Disease: Afeb, wbc 12.8 yesterday, no Cx, on post-op cefazolin to end 12/26 Cardiovascular: Hx HTN/HLD: BP 127/80, HR 80s; resumed simvastatin Endocrinology: No Hx DM, BGs on BMET wnl; carb modified diet Gastrointestinal / Nutrition: PO PPI Neurology: Pain 3<--6, Norco, percocet, switched from IV pain meds Nephrology: SCr 0.78 stable, CrCl >100 ml/min,  lytes wnl Pulmonary: 95% RA Hematology / Oncology: Hgb 15.9, plt 210 yesterday; no CBC today s/p surgery PTA Medication Issues: lansoprazole and simvastatin 40mg  PTA addressed/resumed Best Practices: Coumadin, PO PPI   Assessment: 50 yo M on Coumadin for VTE ppx s/p ORIF of 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 Baseline INR is 0.97. INR today is subtherapeutic as expected; no CBC today, no bleeding noted.   Goal of Therapy:  INR 2-3   Plan:  1) Repeat warfarin 10 mg po x1 2) F/u daily INR   Maudry Mayhew N 01/17/2011,9:46 AM

## 2011-01-17 NOTE — Progress Notes (Signed)
Subjective: 1 Day Post-Op Procedure(s) (LRB): OPEN REDUCTION INTERNAL FIXATION (ORIF) ANKLE FRACTURE (Right) Nausea from PCA    Objective: Vital signs in last 24 hours: Temp:  [97.7 F (36.5 C)-99 F (37.2 C)] 99 F (37.2 C) (12/25 0521) Pulse Rate:  [64-84] 84  (12/25 0521) Resp:  [8-20] 18  (12/25 0540) BP: (110-144)/(62-86) 130/82 mmHg (12/25 0521) SpO2:  [91 %-100 %] 97 % (12/25 0540) Weight:  [95.255 kg (210 lb)] 210 lb (95.255 kg) (12/24 1121)  Intake/Output from previous day: 12/24 0701 - 12/25 0700 In: 3200 [I.[name].:3200] Out: -  Intake/Output this shift:     Basename 01/16/11 1135  HGB 15.9    Basename 01/16/11 1135  WBC 12.8*  RBC 5.17  HCT 43.6  PLT 210    Basename 01/16/11 1135  NA 136  K 3.3*  CL 100  CO2 19  BUN 11  CREATININE 0.77  GLUCOSE 151*  CALCIUM 9.4    Basename 01/16/11 1135  LABPT --  INR 0.97    Neurologically intact  Assessment/Plan: 1 Day Post-Op Procedure(s) (LRB): OPEN REDUCTION INTERNAL FIXATION (ORIF) ANKLE FRACTURE (Right) Up with therapy, continue wound VAC, main concern is the viability of the traumatic wound med malleolus   Xavier [name] 01/17/2011, 5:56 AM

## 2011-01-17 NOTE — Progress Notes (Signed)
Wasted 13.0 mg of dilaudid, witnessed by Dorothyann Peng RN

## 2011-01-18 LAB — BASIC METABOLIC PANEL
BUN: 5 mg/dL — ABNORMAL LOW (ref 6–23)
Calcium: 8.5 mg/dL (ref 8.4–10.5)
GFR calc Af Amer: >90 mL/min (ref >90)
GFR calc non Af Amer: >90 mL/min (ref >90)
Potassium: 3.6 mEq/L (ref 3.5–5.1)
Sodium: 138 mEq/L (ref 135–145)

## 2011-01-18 LAB — PROTIME-INR: Prothrombin Time: 18.5 seconds — ABNORMAL HIGH (ref 11.6–15.2)

## 2011-01-18 MED ORDER — WARFARIN SODIUM 7.5 MG PO TABS
7.5000 mg | ORAL_TABLET | Freq: Once | ORAL | Status: AC
  Administered 2011-01-18: 7.5 mg via ORAL
  Filled 2011-01-18: qty 1

## 2011-01-18 NOTE — Progress Notes (Signed)
Wound Vac dressing changed performed, pt tolerated well. He is hoping to go home Friday.

## 2011-01-18 NOTE — Progress Notes (Signed)
CARE MANAGEMENT NOTE 01/18/2011 Discharge planning.  Spoke with patient. He is self employed, this is not worker's comp. Will order crutches and wait for MD to determine if wound vac is needed for home.

## 2011-01-18 NOTE — Progress Notes (Signed)
Physical Therapy Evaluation Patient Details Name: Xavier [name] MRN: [id number] DOB: September 25, 1960 Today's Date: 01/18/2011  Problem List: There is no problem list on file for this patient.   Past Medical History:  Past Medical History  Diagnosis Date  . Hypertension   . Hypercholesteremia    Past Surgical History: History reviewed. No pertinent past surgical history.  PT Assessment/Plan/Recommendation PT Assessment Clinical Impression Statement: Pt presents with a medical diagnosis of R ankle fx along with the following impairments/deficits and therapy diagnosis listed below. Pt will benefit from skilled PT in the acute care setting in order to maximize functional mobility for a safe d/c home. PT Recommendation/Assessment: Patient will need skilled PT in the acute care venue PT Problem List: Decreased strength;Decreased range of motion;Decreased activity tolerance;Decreased mobility;Decreased knowledge of use of DME;Decreased knowledge of precautions;Pain Barriers to Discharge: Inaccessible home environment (multiple stair cases) PT Therapy Diagnosis : Acute pain;Abnormality of gait PT Plan PT Frequency: Min 5X/week PT Treatment/Interventions: DME instruction;Gait training;Stair training;Functional mobility training;Therapeutic activities;Therapeutic exercise;Patient/family education PT Recommendation Follow Up Recommendations: Home health PT;24 hour supervision/assistance Equipment Recommended: Other (comment) (crutches) PT Goals  Acute Rehab PT Goals PT Goal Formulation: With patient Time For Goal Achievement: 7 days Pt will go Supine/Side to Sit: with modified independence PT Goal: Supine/Side to Sit - Progress: Progressing toward goal Pt will go Sit to Supine/Side: with modified independence PT Goal: Sit to Supine/Side - Progress: Progressing toward goal Pt will go Sit to Stand: with modified independence PT Goal: Sit to Stand - Progress: Progressing toward goal Pt will go  Stand to Sit: with modified independence PT Goal: Stand to Sit - Progress: Progressing toward goal Pt will Transfer Bed to Chair/Chair to Bed: with supervision PT Transfer Goal: Bed to Chair/Chair to Bed - Progress: Progressing toward goal Pt will Ambulate: 51 - 150 feet;with supervision;with least restrictive assistive device PT Goal: Ambulate - Progress: Progressing toward goal Pt will Go Up / Down Stairs: Flight;with rolling walker;with min assist PT Goal: Up/Down Stairs - Progress: Other (comment) (NT) Pt will Perform Home Exercise Program: Independently PT Goal: Perform Home Exercise Program - Progress: Progressing toward goal  PT Evaluation Precautions/Restrictions  Restrictions Weight Bearing Restrictions: Yes RLE Weight Bearing: Non weight bearing Prior Functioning  Home Living Lives With: Son;Significant other Receives Help From: Family Type of Home: House Home Layout: Two level Alternate Level Stairs-Rails: Can reach both Alternate Level Stairs-Number of Steps: 12 Home Access: Stairs to enter [employer] of Steps: 1 Bathroom Shower/Tub: Engineer, manufacturing systems: Standard Bathroom Accessibility: Yes How Accessible: Accessible via walker Home Adaptive Equipment: None Prior Function Level of Independence: Independent with basic ADLs;Independent with gait;Requires assistive device for independence;Independent with homemaking with ambulation Able to Take Stairs?: Yes Driving: Yes Vocation: Full time employment Cognition Cognition Arousal/Alertness: Awake/alert Overall Cognitive Status: Appears within functional limits for tasks assessed Orientation Level: Oriented X4 Sensation/Coordination Sensation Light Touch: Appears Intact Extremity Assessment RLE Assessment RLE Assessment: Exceptions to [facility] RLE AROM (degrees) Overall AROM Right Lower Extremity: Due to precautions;Due to pain;Unable to assess (Hip and Knee WFL; Ankle in wound vac) RLE  Strength RLE Overall Strength: Deficits;Due to pain (Hip and Knee WFL; Ankle NT) LLE Assessment LLE Assessment: Within Functional Limits Mobility (including Balance) Bed Mobility Bed Mobility: Yes Supine to Sit: 5: Supervision;HOB elevated (Comment degrees) (30) Supine to Sit Details (indicate cue type and reason): VC for technique for a safe transfer Sitting - Scoot to Edge of Bed: 5: Supervision Sitting - Scoot to  Edge of Bed Details (indicate cue type and reason): VC for hand placement Transfers Transfers: Yes Sit to Stand: 4: Min assist;With upper extremity assist;From bed Sit to Stand Details (indicate cue type and reason): Vc for hand placement and technique with crutches Stand to Sit: 4: Min assist;With upper extremity assist;To chair/3-in-1 Stand to Sit Details: VC for hand placement and technique for safety with crutches Ambulation/Gait Ambulation/Gait: Yes Ambulation/Gait Assistance: 4: Min assist Ambulation/Gait Assistance Details (indicate cue type and reason): VC for technique with crutches. Pt able to maintain balance on crutches safely with min assist for stability.  Ambulation Distance (Feet): 40 Feet Assistive device: Crutches Gait Pattern: Step-to pattern;Decreased step length - left;Trunk flexed Gait velocity: Decreased gait speed Stairs: No    Exercise    End of Session PT - End of Session Equipment Utilized During Treatment: Gait belt;Other (comment) (Wound VAC; Cam-boot on RLE) Activity Tolerance: Patient tolerated treatment well Patient left: in chair;with call bell in reach Nurse Communication: Mobility status for transfers;Mobility status for ambulation General Behavior During Session: [facility] for tasks performed Cognition: [facility] for tasks performed  Milana Kidney 01/18/2011, 12:55 PM  01/18/2011 Milana Kidney DPT PAGER: [phone] OFFICE: [phone]

## 2011-01-18 NOTE — Progress Notes (Signed)
Subjective: 2 Days Post-Op Procedure(s) (LRB): OPEN REDUCTION INTERNAL FIXATION (ORIF) ANKLE FRACTURE (Right) Patient complains of throbbing pain..    Objective: Vital signs in last 24 hours: Temp:  [97.4 F (36.3 C)-100.5 F (38.1 C)] 97.9 F (36.6 C) (12/26 0554) Pulse Rate:  [75-93] 86  (12/26 0554) Resp:  [18-20] 18  (12/26 0554) BP: (126-152)/(66-83) 152/83 mmHg (12/26 0554) SpO2:  [94 %-96 %] 95 % (12/26 0554)  Intake/Output from previous day: 12/25 0701 - 12/26 0700 In: 720 [P.O.:720] Out: 3200 [Urine:3200] Intake/Output this shift: Total I/O In: -  Out: 1500 [Urine:1500]   Basename 01/16/11 1135  HGB 15.9    Basename 01/16/11 1135  WBC 12.8*  RBC 5.17  HCT 43.6  PLT 210    Basename 01/17/11 0540 01/16/11 1135  NA 136 136  K 3.7 3.3*  CL 100 100  CO2 26 19  BUN 8 11  CREATININE 0.78 0.77  GLUCOSE 125* 151*  CALCIUM 8.3* 9.4    Basename 01/17/11 0540 01/16/11 1135  LABPT -- --  INR 1.07 0.97     On examination the patient's calf is soft. There is no pain with active or passive range of motion of the ankle or toes. No sign of compartment syndrome. The wound VAC is working well. Assessment/Plan: 2 Days Post-Op Procedure(s) (LRB): OPEN REDUCTION INTERNAL FIXATION (ORIF) ANKLE FRACTURE (Right) Discharge home with home health. Possible discharge Friday. Wound VAC to be changed today. Continue IV Kefzol.  Katryn Plummer V 01/18/2011, 6:28 AM

## 2011-01-18 NOTE — Progress Notes (Signed)
ANTICOAGULATION CONSULT NOTE - Follow Up Consult  Pharmacy Consult for Coumadin Indication: VTE prophylaxis  No Known Allergies  Patient Measurements: Height: 6' (182.9 cm) Weight: 210 lb (95.255 kg) IBW/kg (Calculated) : 77.6   Vital Signs: Temp: 97.9 F (36.6 C) (12/26 0554) Temp src: Oral (12/26 0554) BP: 152/83 mmHg (12/26 0554) Pulse Rate: 86  (12/26 0554)  Labs:  Basename 01/18/11 0600 01/17/11 0540 01/16/11 1135  HGB -- -- 15.9  HCT -- -- 43.6  PLT -- -- 210  APTT -- -- --  LABPROT 18.5* 14.1 13.1  INR 1.51* 1.07 0.97  HEPARINUNFRC -- -- --  CREATININE 0.73 0.78 0.77  CKTOTAL -- -- --  CKMB -- -- --  TROPONINI -- -- --   Estimated Creatinine Clearance: 135.3 ml/min (by C-G formula based on Cr of 0.73).   Medications:  Scheduled:    . ceFAZolin (ANCEF) IV  1 g Intravenous Q6H  . docusate sodium  100 mg Oral BID  . pantoprazole  20 mg Oral Q1200  . simvastatin  40 mg Oral QHS  . warfarin  10 mg Oral ONCE-1800  . warfarin   Does not apply Once    Assessment: 50 yo M POD #2 ORIF ankle fracture.  On Coumadin for VTE prophylaxis.  Noted INR rise after 2 x 10 mg Coumadin doses.  Will decrease Coumadin dose tonight.   Goal of Therapy:  INR 2-3   Plan:  Coumadin 7.5mg  PO X 1 tonight. Continue daily INR.  [employer], Pharm.D., BCPS Clinical Pharmacist Pager [phone] 01/18/2011,10:23 AM

## 2011-01-19 ENCOUNTER — Encounter (HOSPITAL_COMMUNITY): Payer: Self-pay | Admitting: Orthopedic Surgery

## 2011-01-19 LAB — BASIC METABOLIC PANEL
Calcium: 9.1 mg/dL (ref 8.4–10.5)
Creatinine, Ser: 0.78 mg/dL (ref 0.50–1.35)
GFR calc non Af Amer: >90 mL/min (ref >90)
Glucose, Bld: 122 mg/dL — ABNORMAL HIGH (ref 70–99)
Sodium: 138 mEq/L (ref 135–145)

## 2011-01-19 MED ORDER — OXYCODONE-ACETAMINOPHEN 5-325 MG PO TABS: 1.0000 | ORAL_TABLET | ORAL | Status: AC | PRN

## 2011-01-19 MED ORDER — WARFARIN SODIUM 7.5 MG PO TABS
7.5000 mg | ORAL_TABLET | Freq: Once | ORAL | Status: AC
  Administered 2011-01-19: 7.5 mg via ORAL
  Filled 2011-01-19: qty 1

## 2011-01-19 MED ORDER — HYDROCODONE-ACETAMINOPHEN 5-500 MG PO TABS: 1.0000 | ORAL_TABLET | Freq: Four times a day (QID) | ORAL | Status: AC | PRN

## 2011-01-19 MED ORDER — MUPIROCIN CALCIUM 2 % EX CREA
TOPICAL_CREAM | Freq: Every day | CUTANEOUS | Status: DC
  Filled 2011-01-19: qty 15

## 2011-01-19 MED ORDER — WARFARIN SODIUM 1 MG PO TABS: 1.0000 mg | ORAL_TABLET | Freq: Every day | ORAL | Status: DC

## 2011-01-19 MED ORDER — MUPIROCIN 2 % EX OINT: TOPICAL_OINTMENT | Freq: Three times a day (TID) | CUTANEOUS | Status: AC

## 2011-01-19 NOTE — Progress Notes (Signed)
Case Manager has offered choice, Home Health PT has been arranged thru [facility]. Crutches have been delivered to patient's room.

## 2011-01-19 NOTE — Progress Notes (Signed)
Physical Therapy Treatment Patient Details Name: Xavier [name] MRN: [id number] DOB: 09/26/1960 Today's Date: 01/19/2011  PT Assessment/Plan  PT - Assessment/Plan Comments on Treatment Session: Pt progressing well. He was able to ambulate an increased distance and complete stairs with only supervision assist for safety. Still safety concerns secondary to impulsivity, therefore still recommending 24/7 upon d/c. PT Plan: Discharge plan remains appropriate PT Frequency: Min 5X/week Follow Up Recommendations: Home health PT;24 hour supervision/assistance Equipment Recommended: Other (comment) (crutches) PT Goals  Acute Rehab PT Goals PT Goal Formulation: With patient PT Goal: Supine/Side to Sit - Progress: Progressing toward goal PT Goal: Sit to Supine/Side - Progress: Progressing toward goal PT Goal: Sit to Stand - Progress: Progressing toward goal PT Goal: Stand to Sit - Progress: Progressing toward goal PT Transfer Goal: Bed to Chair/Chair to Bed - Progress: Met PT Goal: Ambulate - Progress: Progressing toward goal PT Goal: Up/Down Stairs - Progress: Progressing toward goal PT Goal: Perform Home Exercise Program - Progress: Progressing toward goal  PT Treatment Precautions/Restrictions  Restrictions Weight Bearing Restrictions: Yes RLE Weight Bearing: Non weight bearing Mobility (including Balance) Bed Mobility Bed Mobility: Yes Supine to Sit: 5: Supervision;HOB elevated (Comment degrees) Supine to Sit Details (indicate cue type and reason): Supervision for safety secondary to impulsivity Sitting - Scoot to Edge of Bed: 5: Supervision Sitting - Scoot to Edge of Bed Details (indicate cue type and reason): Supervision for safety Transfers Transfers: Yes Sit to Stand: 5: Supervision;With upper extremity assist;From bed (with crutches in 1 UE) Sit to Stand Details (indicate cue type and reason): VC for technique for safety with standing up using crutches Stand to Sit: 5:  Supervision;With upper extremity assist;To chair/3-in-1;Other (comment) (with crutches in 1 UE) Stand to Sit Details: VC for technique and supervision for safety. Pt wit controlled descent into sitting Ambulation/Gait Ambulation/Gait: Yes Ambulation/Gait Assistance: 4: Min assist (Minguard assist) Ambulation/Gait Assistance Details (indicate cue type and reason): Assist for safety secondary to impulsivity. Pt able to maintain balance with slight loss of stability once but pt was able to correct self safely.  Ambulation Distance (Feet): 50 Feet Assistive device: Crutches Gait Pattern: Step-to pattern;Decreased step length - left;Trunk flexed Gait velocity: Decreased gait speed Stairs: Yes Stairs Assistance: 5: Supervision Stairs Assistance Details (indicate cue type and reason): VC for proper stair sequencing to maintain NWB status Stair Management Technique: Two rails;Forwards;Step to pattern Number of Stairs: 4     Exercise    End of Session PT - End of Session Equipment Utilized During Treatment: Gait belt;Other (comment) (Wound VAC; Cam-boot) Activity Tolerance: Patient tolerated treatment well Patient left: in chair;with call bell in reach Nurse Communication: Mobility status for transfers;Mobility status for ambulation General Behavior During Session: [facility] for tasks performed Cognition: [facility] for tasks performed  Milana Kidney 01/19/2011, 12:53 PM  01/19/2011 Milana Kidney DPT PAGER: [phone] OFFICE: [phone]

## 2011-01-19 NOTE — Progress Notes (Signed)
Patient ID: Xavier [name], male   DOB: [birth year], 50 y.o.   MRN: [id number] Plan for d/c Friday. D/C wound vac Friday Apply bactroban drsg Friday Rx: for vicodin, percocet, bactroban, coumadin on chart

## 2011-01-19 NOTE — Progress Notes (Signed)
ANTICOAGULATION CONSULT NOTE - Follow Up Consult  Pharmacy Consult for Coumadin Indication: VTE prophylaxis  No Known Allergies  Patient Measurements: Height: 6' (182.9 cm) Weight: 210 lb (95.255 kg) IBW/kg (Calculated) : 77.6   Vital Signs: Temp: 97.7 F (36.5 C) (12/27 0607) BP: 152/90 mmHg (12/27 0607) Pulse Rate: 84  (12/27 0607)  Labs:  Basename 01/19/11 0525 01/18/11 0600 01/17/11 0540  HGB -- -- --  HCT -- -- --  PLT -- -- --  APTT -- -- --  LABPROT 21.0* 18.5* 14.1  INR 1.78* 1.51* 1.07  HEPARINUNFRC -- -- --  CREATININE 0.78 0.73 0.78  CKTOTAL -- -- --  CKMB -- -- --  TROPONINI -- -- --   Estimated Creatinine Clearance: 135.3 ml/min (by C-G formula based on Cr of 0.78).   Medications:  Scheduled:     . ceFAZolin (ANCEF) IV  1 g Intravenous Q6H  . docusate sodium  100 mg Oral BID  . mupirocin cream   Topical Daily  . pantoprazole  20 mg Oral Q1200  . simvastatin  40 mg Oral QHS  . warfarin  7.5 mg Oral ONCE-1800  . warfarin   Does not apply Once    Assessment: 50 yo M POD #3 ORIF ankle fracture.  On Coumadin for VTE prophylaxis.  INR rising toward goal.  Goal of Therapy:  INR 2-3   Plan:  Repeat Coumadin 7.5mg  PO X 1 tonight. Continue daily INR.  [employer], Pharm.D., BCPS Clinical Pharmacist Pager [phone] 01/19/2011,2:08 PM

## 2011-01-20 DIAGNOSIS — S82209B Unspecified fracture of shaft of unspecified tibia, initial encounter for open fracture type I or II: Secondary | ICD-10-CM | POA: Diagnosis present

## 2011-01-20 LAB — PROTIME-INR
INR: 1.8 — ABNORMAL HIGH (ref 0.00–1.49)
Prothrombin Time: 21.2 seconds — ABNORMAL HIGH (ref 11.6–15.2)

## 2011-01-20 MED ORDER — WARFARIN SODIUM 10 MG PO TABS
10.0000 mg | ORAL_TABLET | Freq: Once | ORAL | Status: DC
  Filled 2011-01-20: qty 1

## 2011-01-20 NOTE — Progress Notes (Signed)
D/C instructions reviewed with patient, girlfriend, mother in law and father in law. Dressing change instructions reviewed at length. Gf and MIL verbalized understanding and comfort with procedure. Some supplies provided. RX x 4 given. All questions answered. hh equipment and services arranged with [facility]. Pt d/c'ed via wheelchair in stable condition

## 2011-01-20 NOTE — Progress Notes (Signed)
Physical Therapy Treatment Patient Details Name: Xavier [name] MRN: [id number] DOB: 1960/02/02 Today's Date: 01/20/2011  PT Assessment/Plan  PT - Assessment/Plan Comments on Treatment Session: Pt safe to d/c home; he was able to ambulate increased distance and complete full flight of stairs at a supervision level. Pt education complete PT Plan: Discharge plan remains appropriate PT Frequency: Min 5X/week Follow Up Recommendations: Home health PT;24 hour supervision/assistance Equipment Recommended: Other (comment) (crutches) PT Goals  Acute Rehab PT Goals PT Goal Formulation: With patient PT Goal: Supine/Side to Sit - Progress: Met PT Goal: Sit to Supine/Side - Progress: Met PT Goal: Sit to Stand - Progress: Met PT Goal: Stand to Sit - Progress: Met PT Transfer Goal: Bed to Chair/Chair to Bed - Progress: Met PT Goal: Ambulate - Progress: Met PT Goal: Up/Down Stairs - Progress: Met  PT Treatment Precautions/Restrictions  Restrictions Weight Bearing Restrictions: Yes RLE Weight Bearing: Non weight bearing Mobility (including Balance) Bed Mobility Bed Mobility: Yes Supine to Sit: 6: Modified independent (Device/Increase time) Sitting - Scoot to Edge of Bed: 6: Modified independent (Device/Increase time) Transfers Transfers: Yes Sit to Stand: 6: Modified independent (Device/Increase time) Stand to Sit: 6: Modified independent (Device/Increase time) Ambulation/Gait Ambulation/Gait: Yes Ambulation/Gait Assistance: 5: Supervision Ambulation/Gait Assistance Details (indicate cue type and reason): Supervision secondary to impulsivity for safety Ambulation Distance (Feet): 100 Feet Assistive device: Crutches Gait Pattern: Step-to pattern;Decreased step length - left;Trunk flexed Gait velocity: Normal gait speed Stairs: Yes Stairs Assistance: 5: Supervision Stairs Assistance Details (indicate cue type and reason): VC for sequencing. Pt able to complete while maintaining NWB    Stair Management Technique: Two rails;Forwards;Step to pattern Number of Stairs: 13     Exercise    End of Session PT - End of Session Equipment Utilized During Treatment: Gait belt;Other (comment) (CAm-boot on RLE) Activity Tolerance: Patient tolerated treatment well Patient left: in chair;with call bell in reach;with family/visitor present Nurse Communication: Mobility status for transfers;Mobility status for ambulation General Behavior During Session: [facility] for tasks performed Cognition: [facility] for tasks performed  Milana Kidney 01/20/2011, 3:21 PM  01/20/2011 Milana Kidney DPT PAGER: [phone] OFFICE: [phone]

## 2011-01-20 NOTE — Progress Notes (Signed)
ANTICOAGULATION CONSULT NOTE - Follow Up Consult  Pharmacy Consult for Coumadin Indication: VTE prophylaxis  No Known Allergies  Patient Measurements: Height: 6' (182.9 cm) Weight: 210 lb (95.255 kg) IBW/kg (Calculated) : 77.6  Adjusted Body Weight:   Vital Signs: Temp: 97.8 F (36.6 C) (12/28 0647) BP: 149/86 mmHg (12/28 0647) Pulse Rate: 97  (12/28 0647)  Labs:  Basename 01/20/11 0551 01/19/11 0525 01/18/11 0600  HGB -- -- --  HCT -- -- --  PLT -- -- --  APTT -- -- --  LABPROT 21.2* 21.0* 18.5*  INR 1.80* 1.78* 1.51*  HEPARINUNFRC -- -- --  CREATININE -- 0.78 0.73  CKTOTAL -- -- --  CKMB -- -- --  TROPONINI -- -- --   Estimated Creatinine Clearance: 135.3 ml/min (by C-G formula based on Cr of 0.78).  Assessment: 50yom on Coumadin for VTE prophylaxis s/p ORIF ankle fracture. INR (1.8) is subtherapeutic and response to 7.5mg  doses has diminished. - No CBC since 12/24 - No significant bleeding reported  Goal of Therapy:  INR 2-3   Plan:  1. Coumadin 10mg  po x 1 today 2. Follow-up AM INR and discharge plans  Xavier, [name] [phone] 01/20/2011,9:18 AM

## 2011-01-20 NOTE — Discharge Summary (Signed)
Physician Discharge Summary  Patient ID: Xavier [name] MRN: [id number] DOB/AGE: 48-Jun-1962 48 y.o.  Admit date: 01/16/2011 Discharge date: 01/20/2011  Admission Diagnoses:  Fracture of tibia and fibula, open  Discharge Diagnoses:  Principal Problem:  *Fracture of tibia and fibula, open   Past Medical History  Diagnosis Date  . Hypertension   . Hypercholesteremia     Surgeries: Procedure(s): OPEN REDUCTION INTERNAL FIXATION (ORIF) ANKLE FRACTURE on 01/16/2011   Consultants (if any):    Discharged Condition: Improved  Hospital Course: Xavier [name] is an 50 y.o. male who was admitted 01/16/2011 with a diagnosis of Fracture of tibia and fibula, open and went to the operating room on 01/16/2011 and underwent the above named procedures.    He was given perioperative antibiotics:  Anti-infectives     Start     Dose/Rate Route Frequency Ordered Stop   01/16/11 1800   ceFAZolin (ANCEF) IVPB 1 g/50 mL premix        1 g 100 mL/hr over 30 Minutes Intravenous Every 6 hours 01/16/11 1644 01/18/11 1742   01/16/11 1230   gentamicin (GARAMYCIN) 120 mg in dextrose 5 % 50 mL IVPB  Status:  Discontinued        120 mg 106 mL/hr over 30 Minutes Intravenous To Surgery 01/16/11 1229 01/16/11 1630   01/16/11 1130   ceFAZolin (ANCEF) IVPB 1 g/50 mL premix        1 g 100 mL/hr over 30 Minutes Intravenous  Once 01/16/11 1126 01/16/11 1246        .  He was given sequential compression devices, early ambulation, and chemoprophylaxis for DVT prophylaxis.  He benefited maximally from their hospital stay and there were no complications.   Wound VAC used during hospital stay and removed prior to dc with local wound care started Recent vital signs:  Filed Vitals:   01/20/11 0647  BP: 149/86  Pulse: 97  Temp: 97.8 F (36.6 C)  Resp: 18    Recent laboratory studies:  Lab Results  Component Value Date   HGB 15.9 01/16/2011   Lab Results  Component Value Date   WBC 12.8* 01/16/2011    PLT 210 01/16/2011   Lab Results  Component Value Date   INR 1.80* 01/20/2011   Lab Results  Component Value Date   NA 138 01/19/2011   K 3.8 01/19/2011   CL 100 01/19/2011   CO2 29 01/19/2011   BUN 6 01/19/2011   CREATININE 0.78 01/19/2011   GLUCOSE 122* 01/19/2011    Discharge Medications:   Current Discharge Medication List    START taking these medications   Details  HYDROcodone-acetaminophen (VICODIN) 5-500 MG per tablet Take 1 tablet by mouth every 6 (six) hours as needed for pain. Qty: 30 tablet, Refills: 0    mupirocin ointment (BACTROBAN) 2 % Apply topically 3 (three) times daily. Apply topically daily to wounds Qty: 22 g, Refills: 3    oxyCODONE-acetaminophen (ROXICET) 5-325 MG per tablet Take 1 tablet by mouth every 4 (four) hours as needed for pain. Qty: 60 tablet, Refills: 0    warfarin (COUMADIN) 1 MG tablet Take 1 tablet (1 mg total) by mouth daily. Qty: 30 tablet, Refills: 0      CONTINUE these medications which have NOT CHANGED   Details  lansoprazole (PREVACID) 30 MG capsule Take 30 mg by mouth daily.      simvastatin (ZOCOR) 40 MG tablet Take 40 mg by mouth at bedtime.  Diagnostic Studies: Dg Ankle Right Port  01/16/2011  *RADIOLOGY REPORT*  Clinical Data: Open ankle fracture.  PORTABLE RIGHT ANKLE - 2 VIEW  Comparison: None.  Findings: Markedly comminuted distal right tibial and fibular fractures.  The distal tibial shaft appears to disrupt the skin surface.  The tibial shaft is displaced 2.6 cm medially relative to the distal fragment.  IMPRESSION: Comminuted, displaced open distal tibial and fibular fractures.  Original Report Authenticated By: Cyndie Chime, M.D.    Disposition: Final discharge disposition not confirmed  Discharge Orders    Future Orders Please Complete By Expires   Ambulatory referral to Home Health      Comments:   Please evaluate EDREES VALENT for admission to [facility].  Disciplines requested: Physical  Therapy  Services to provide: Other: DME as required  Physician to follow patient's care (the person listed here will be responsible for signing ongoing orders): Referring Provider  Requested Start of Care Date: Within 2-3 days  Special Instructions:  Nonweightbearing strict elevation right lower extremity   Diet - low sodium heart healthy      Call MD / Call 911      Comments:   If you experience chest pain or shortness of breath, CALL 911 and be transported to the hospital emergency room.  If you develope a fever above 101 F, pus (white drainage) or increased drainage or redness at the wound, or calf pain, call your surgeon's office.   Constipation Prevention      Comments:   Drink plenty of fluids.  Prune juice may be helpful.  You may use a stool softener, such as Colace (over the counter) 100 mg twice a day.  Use MiraLax (over the counter) for constipation as needed.   Increase activity slowly as tolerated      Weight Bearing as taught in Physical Therapy      Scheduling Instructions:   Nonweightbearing right lower extremity strict elevation.   Comments:   Use a walker or crutches as instructed.   Discharge instructions      Scheduling Instructions:   Keep dressing dry and clean at all times. Change dressing daily.  Apply mupirocin clean gauze and ace wrap. Keep elevated at all times when at rest   Comments:   Removed wound VAC at time of discharge apply Bactroban plus 4 x 4 gauze 2 both surgical incisions and apply Ace compressive wrap.         Signed: Wende Neighbors 01/20/2011, 10:11 AM

## 2011-01-20 NOTE — Progress Notes (Signed)
Subjective: 4 Days Post-Op Procedure(s) (LRB): OPEN REDUCTION INTERNAL FIXATION (ORIF) ANKLE FRACTURE (Right) Patient reports pain as mild.    Objective: Vital signs in last 24 hours: Temp:  [97.8 F (36.6 C)-98.9 F (37.2 C)] 97.8 F (36.6 C) (12/28 0647) Pulse Rate:  [92-97] 97  (12/28 0647) Resp:  [18-20] 18  (12/28 0647) BP: (128-149)/(79-86) 149/86 mmHg (12/28 0647) SpO2:  [96 %-98 %] 96 % (12/28 0647)  Intake/Output from previous day: 12/27 0701 - 12/28 0700 In: -  Out: 1400 [Urine:1400] Intake/Output this shift:    No results found for this basename: HGB:5 in the last 72 hours No results found for this basename: WBC:2,RBC:2,HCT:2,PLT:2 in the last 72 hours  Basename 01/19/11 0525 01/18/11 0600  NA 138 138  K 3.8 3.6  CL 100 103  CO2 29 28  BUN 6 5*  CREATININE 0.78 0.73  GLUCOSE 122* 118*  CALCIUM 9.1 8.5    Basename 01/20/11 0551 01/19/11 0525  LABPT -- --  INR 1.80* 1.78*    Neurovascular intact Dorsiflexion/Plantar flexion intact Incision: VAC to be removed today and new dressing appplied Compartment soft Slow with motion of ankle because of discomfort but can actively move ankle with DF and PF Assessment/Plan: 4 Days Post-Op Procedure(s) (LRB): OPEN REDUCTION INTERNAL FIXATION (ORIF) ANKLE FRACTURE (Right) Discharge home with home health  Jeidy Hoerner M 01/20/2011, 10:08 AM

## 2011-03-16 ENCOUNTER — Other Ambulatory Visit: Payer: Self-pay | Admitting: Orthopedic Surgery

## 2011-03-16 ENCOUNTER — Encounter (HOSPITAL_COMMUNITY): Payer: Self-pay | Admitting: General Practice

## 2011-03-16 ENCOUNTER — Inpatient Hospital Stay (HOSPITAL_COMMUNITY)
Admission: AD | Admit: 2011-03-16 | Discharge: 2011-03-20 | DRG: 278 | Disposition: A | Discharge status: Home / Self Care | Payer: BC Managed Care – PPO | Source: Ambulatory Visit | Attending: Orthopedic Surgery | Admitting: Orthopedic Surgery

## 2011-03-16 DIAGNOSIS — I10 Essential (primary) hypertension: Secondary | ICD-10-CM | POA: Diagnosis present

## 2011-03-16 DIAGNOSIS — E7800 Pure hypercholesterolemia, unspecified: Secondary | ICD-10-CM | POA: Diagnosis present

## 2011-03-16 DIAGNOSIS — Z87891 Personal history of nicotine dependence: Secondary | ICD-10-CM

## 2011-03-16 DIAGNOSIS — L270 Generalized skin eruption due to drugs and medicaments taken internally: Secondary | ICD-10-CM | POA: Diagnosis present

## 2011-03-16 DIAGNOSIS — S82209B Unspecified fracture of shaft of unspecified tibia, initial encounter for open fracture type I or II: Secondary | ICD-10-CM

## 2011-03-16 DIAGNOSIS — Z79899 Other long term (current) drug therapy: Secondary | ICD-10-CM

## 2011-03-16 DIAGNOSIS — L02419 Cutaneous abscess of limb, unspecified: Principal | ICD-10-CM | POA: Diagnosis present

## 2011-03-16 DIAGNOSIS — T368X5A Adverse effect of other systemic antibiotics, initial encounter: Secondary | ICD-10-CM | POA: Diagnosis present

## 2011-03-16 LAB — COMPREHENSIVE METABOLIC PANEL
Albumin: 4.2 g/dL (ref 3.5–5.2)
BUN: 10 mg/dL (ref 6–23)
Calcium: 10.3 mg/dL (ref 8.4–10.5)
GFR calc Af Amer: >90 mL/min (ref >90)
Glucose, Bld: 125 mg/dL — ABNORMAL HIGH (ref 70–99)
Potassium: 3.6 mEq/L (ref 3.5–5.1)
Sodium: 138 mEq/L (ref 135–145)
Total Protein: 8 g/dL (ref 6.0–8.3)

## 2011-03-16 LAB — PROTIME-INR
INR: 1.02 (ref 0.00–1.49)
Prothrombin Time: 13.6 seconds (ref 11.6–15.2)

## 2011-03-16 MED ORDER — SODIUM CHLORIDE 0.9 % IV SOLN
INTRAVENOUS | Status: DC
  Administered 2011-03-16 – 2011-03-19 (×2): 20 mL/h via INTRAVENOUS

## 2011-03-16 MED ORDER — PANTOPRAZOLE SODIUM 20 MG PO TBEC
20.0000 mg | DELAYED_RELEASE_TABLET | Freq: Every day | ORAL | Status: DC
  Administered 2011-03-16 – 2011-03-20 (×5): 20 mg via ORAL
  Filled 2011-03-16 (×5): qty 1

## 2011-03-16 MED ORDER — PIPERACILLIN-TAZOBACTAM 3.375 G IVPB
3.3750 g | Freq: Three times a day (TID) | INTRAVENOUS | Status: DC
  Administered 2011-03-16 – 2011-03-19 (×10): 3.375 g via INTRAVENOUS
  Filled 2011-03-16 (×12): qty 50

## 2011-03-16 MED ORDER — DIPHENHYDRAMINE HCL 50 MG/ML IJ SOLN
INTRAMUSCULAR | Status: AC
  Administered 2011-03-16: 50 mg
  Filled 2011-03-16: qty 1

## 2011-03-16 MED ORDER — OXYCODONE-ACETAMINOPHEN 5-325 MG PO TABS
1.0000 | ORAL_TABLET | Freq: Four times a day (QID) | ORAL | Status: DC | PRN
  Administered 2011-03-16 – 2011-03-17 (×3): 2 via ORAL
  Administered 2011-03-18: 1 via ORAL
  Administered 2011-03-18 – 2011-03-20 (×6): 2 via ORAL
  Filled 2011-03-16 (×10): qty 2

## 2011-03-16 MED ORDER — WARFARIN SODIUM 1 MG PO TABS
1.0000 mg | ORAL_TABLET | Freq: Every day | ORAL | Status: DC
  Administered 2011-03-16 – 2011-03-19 (×4): 1 mg via ORAL
  Filled 2011-03-16 (×5): qty 1

## 2011-03-16 MED ORDER — HYDROXYZINE HCL 50 MG PO TABS
50.0000 mg | ORAL_TABLET | Freq: Three times a day (TID) | ORAL | Status: DC | PRN
  Administered 2011-03-17 – 2011-03-19 (×5): 50 mg via ORAL
  Filled 2011-03-16 (×5): qty 1

## 2011-03-16 MED ORDER — VANCOMYCIN HCL IN DEXTROSE 1-5 GM/200ML-% IV SOLN
1000.0000 mg | Freq: Three times a day (TID) | INTRAVENOUS | Status: DC
  Administered 2011-03-17 – 2011-03-19 (×9): 1000 mg via INTRAVENOUS
  Filled 2011-03-16 (×11): qty 200

## 2011-03-16 MED ORDER — VANCOMYCIN HCL IN DEXTROSE 1-5 GM/200ML-% IV SOLN
1000.0000 mg | INTRAVENOUS | Status: AC
  Administered 2011-03-16: 1000 mg via INTRAVENOUS
  Filled 2011-03-16: qty 200

## 2011-03-16 MED ORDER — SIMVASTATIN 40 MG PO TABS
40.0000 mg | ORAL_TABLET | Freq: Every day | ORAL | Status: DC
  Administered 2011-03-16 – 2011-03-19 (×4): 40 mg via ORAL
  Filled 2011-03-16 (×8): qty 1

## 2011-03-16 MED ORDER — DIPHENHYDRAMINE HCL 25 MG PO CAPS
50.0000 mg | ORAL_CAPSULE | Freq: Four times a day (QID) | ORAL | Status: AC | PRN
  Administered 2011-03-16: 50 mg via ORAL
  Filled 2011-03-16: qty 2

## 2011-03-16 NOTE — Progress Notes (Signed)
Nurse called indicating patient has rash associated with reaction to cipro. He was taking benadryl at home. Okay to use benadryl for this. I will allow one dose then start Vistaril 50mg  up to tid for itching. Dr. Audrie Lia office note does not indicate that surgery is planned at this point.

## 2011-03-16 NOTE — Progress Notes (Signed)
ANTIBIOTIC CONSULT NOTE - INITIAL  Pharmacy Consult for vancomycin Indication: foot infection  No Known Allergies  Patient Measurements:   Adjusted Body Weight:   Vital Signs: Temp: 98.1 F (36.7 C) (02/21 1647) BP: 155/78 mmHg (02/21 1647) Pulse Rate: 112  (02/21 1647) Intake/Output from previous day:   Intake/Output from this shift:    Labs: No results found for this basename: WBC:3,HGB:3,PLT:3,LABCREA:3,CREATININE:3 in the last 72 hours The CrCl is unknown because both a height and weight (above a minimum accepted value) are required for this calculation. No results found for this basename: VANCOTROUGH:2,VANCOPEAK:2,VANCORANDOM:2,GENTTROUGH:2,GENTPEAK:2,GENTRANDOM:2,TOBRATROUGH:2,TOBRAPEAK:2,TOBRARND:2,AMIKACINPEAK:2,AMIKACINTROU:2,AMIKACIN:2, in the last 72 hours   Microbiology: No results found for this or any previous visit (from the past 720 hour(s)).  Medical History: Past Medical History  Diagnosis Date  . Hypertension   . Hypercholesteremia     Medications:  Prescriptions prior to admission  Medication Sig Dispense Refill  . ciprofloxacin (CIPRO) 500 MG tablet Take 500 mg by mouth 2 (two) times daily. Stop date is unknown      . doxycycline (VIBRA-TABS) 100 MG tablet Take 100 mg by mouth 2 (two) times daily. Stop date is unknown      . lansoprazole (PREVACID) 30 MG capsule Take 30 mg by mouth daily.        . simvastatin (ZOCOR) 40 MG tablet Take 40 mg by mouth at bedtime.         Assessment: Patient is a 51 y.o M s/p right ankle fracture back in December.  Right ankle was fixed and wound eventually was infected.  Per patient, he was prescribed cipro and doxycycline this past Monday for the infection but developed a rash which he thought was due to the antibiotics.  Now admitted for treatment with IV antibiotics.  He was also started on coumadin after the procedure for his ankle in December.  Patient said he stopped taking coumadin about 2-3 weeks ago after his  coumadin bottle ran out.  He's not sure what coumadin dose he was on.  Goal of Therapy:  Vancomycin trough= 15-20  Plan:  1) Vancomycin 1gm IV q8h 2) Coumadin 1mg  PO daily per MD   Arlester Marker, Margia Wiesen P 03/16/2011,5:43 PM

## 2011-03-17 ENCOUNTER — Inpatient Hospital Stay (HOSPITAL_COMMUNITY): Payer: BC Managed Care – PPO

## 2011-03-17 LAB — VANCOMYCIN, TROUGH: Vancomycin Tr: 10.5 ug/mL (ref 10.0–20.0)

## 2011-03-17 LAB — PROTIME-INR
INR: 1.03 (ref 0.00–1.49)
Prothrombin Time: 13.7 s (ref 11.6–15.2)

## 2011-03-17 LAB — CBC
Hemoglobin: 15.4 g/dL (ref 13.0–17.0)
MCH: 30.4 pg (ref 26.0–34.0)
MCHC: 35.6 g/dL (ref 30.0–36.0)
RDW: 13 % (ref 11.5–15.5)

## 2011-03-17 LAB — C-REACTIVE PROTEIN: CRP: 2.07 mg/dL — ABNORMAL HIGH (ref <0.60)

## 2011-03-17 MED ORDER — DIPHENHYDRAMINE HCL 25 MG PO CAPS
50.0000 mg | ORAL_CAPSULE | Freq: Four times a day (QID) | ORAL | Status: DC | PRN
  Administered 2011-03-17 – 2011-03-20 (×12): 50 mg via ORAL
  Filled 2011-03-17 (×12): qty 2

## 2011-03-17 MED ORDER — MUPIROCIN CALCIUM 2 % EX CREA
TOPICAL_CREAM | Freq: Two times a day (BID) | CUTANEOUS | Status: DC
  Administered 2011-03-17: 1 via TOPICAL
  Administered 2011-03-17 – 2011-03-20 (×6): via TOPICAL
  Filled 2011-03-17: qty 15

## 2011-03-17 MED ORDER — GADOBENATE DIMEGLUMINE 529 MG/ML IV SOLN
20.0000 mL | Freq: Once | INTRAVENOUS | Status: AC
  Administered 2011-03-17: 20 mL via INTRAVENOUS

## 2011-03-17 MED ORDER — DIPHENHYDRAMINE HCL 25 MG PO TABS
50.0000 mg | ORAL_TABLET | Freq: Four times a day (QID) | ORAL | Status: DC | PRN
  Filled 2011-03-17: qty 2

## 2011-03-17 MED ORDER — HYDROCORTISONE 1 % EX CREA
TOPICAL_CREAM | Freq: Four times a day (QID) | CUTANEOUS | Status: DC
  Administered 2011-03-17 – 2011-03-20 (×11): via TOPICAL
  Filled 2011-03-17 (×3): qty 28

## 2011-03-17 NOTE — H&P (Signed)
Xavier [name] is an 51 y.o. male.   Chief Complaint: Abscess right ankle HPI: Patient is a 51 year old gentleman who presents 2 months status post internal fixation for a Pan fracture of the right ankle. Patient developed acute abscess medially over the site of the traumatic open wound. 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 Patient was started on by mouth antibiotics with doxycycline and ciprofloxacin. Patient developed an acute rash to the antibiotics and he presents at this time for hospitalization IV antibiotics and potential surgical debridement.  Past Medical History  Diagnosis Date  . Hypertension   . Hypercholesteremia     Past Surgical History  Procedure Date  . Orif ankle fracture 01/16/2011    Procedure: OPEN REDUCTION INTERNAL FIXATION (ORIF) ANKLE FRACTURE;  Surgeon: Nadara Mustard, MD;  Location: [facility] OR;  Service: Orthopedics;  Laterality: Right;  open reduction and internal fixation tib/fib fracture  . Fracture surgery 01/16/11    right    History reviewed. No pertinent family history. Social History:  reports that he has quit smoking. His smoking use included Cigarettes. He has a 15 pack-year smoking history. He has never used smokeless tobacco. He reports that he drinks about 12.6 ounces of alcohol per week. He reports that he does not use illicit drugs.  Allergies: No Known Allergies  Medications Prior to Admission  Medication Dose Route Frequency Provider Last Rate Last Dose  . 0.9 %  sodium chloride infusion   Intravenous Continuous Nadara Mustard, MD 20 mL/hr at 03/16/11 1805 20 mL/hr at 03/16/11 1805  . diphenhydrAMINE (BENADRYL) 50 MG/ML injection        50 mg at 03/16/11 1806  . diphenhydrAMINE (BENADRYL) capsule 50 mg  50 mg Oral Q6H PRN Kerrin Champagne, MD   50 mg at 03/16/11 2231  . hydrOXYzine (ATARAX/VISTARIL) tablet 50 mg  50 mg Oral TID PRN Kerrin Champagne, MD   50 mg at 03/17/11 0058  . oxyCODONE-acetaminophen (PERCOCET) 5-325  MG per tablet 1-2 tablet  1-2 tablet Oral Q6H PRN Nadara Mustard, MD   2 tablet at 03/16/11 2109  . pantoprazole (PROTONIX) EC tablet 20 mg  20 mg Oral Q1200 Nadara Mustard, MD   20 mg at 03/16/11 1825  . piperacillin-tazobactam (ZOSYN) IVPB 3.375 g  3.375 g Intravenous Q8H Nadara Mustard, MD   3.375 g at 03/17/11 0040  . simvastatin (ZOCOR) tablet 40 mg  40 mg Oral QHS Nadara Mustard, MD   40 mg at 03/16/11 2232  . vancomycin (VANCOCIN) IVPB 1000 mg/200 mL premix  1,000 mg Intravenous NOW Anh P Pham, PHARMD   1,000 mg at 03/16/11 2000  . vancomycin (VANCOCIN) IVPB 1000 mg/200 mL premix  1,000 mg Intravenous Q8H Anh P Pham, PHARMD   1,000 mg at 03/17/11 0157  . warfarin (COUMADIN) tablet 1 mg  1 mg Oral q1800 Nadara Mustard, MD   1 mg at 03/16/11 1958   Medications Prior to Admission  Medication Sig Dispense Refill  . lansoprazole (PREVACID) 30 MG capsule Take 30 mg by mouth daily.        . simvastatin (ZOCOR) 40 MG tablet Take 40 mg by mouth at bedtime.          Results for orders placed during the hospital encounter of 03/16/11 (from the past 48 hour(s))  COMPREHENSIVE METABOLIC PANEL     Status: Abnormal   Collection Time   03/16/11  5:06 PM  Component Value Range Comment   Sodium 138  135 - 145 (mEq/L)    Potassium 3.6  3.5 - 5.1 (mEq/L)    Chloride 99  96 - 112 (mEq/L)    CO2 27  19 - 32 (mEq/L)    Glucose, Bld 125 (*) 70 - 99 (mg/dL)    BUN 10  6 - 23 (mg/dL)    Creatinine, Ser 6.96  0.50 - 1.35 (mg/dL)    Calcium 29.5  8.4 - 10.5 (mg/dL)    Total Protein 8.0  6.0 - 8.3 (g/dL)    Albumin 4.2  3.5 - 5.2 (g/dL)    AST 32  0 - 37 (U/L)    ALT 62 (*) 0 - 53 (U/L)    Alkaline Phosphatase 72  39 - 117 (U/L)    Total Bilirubin 0.7  0.3 - 1.2 (mg/dL)    GFR calc non Af Amer >90  >90 (mL/min)    GFR calc Af Amer >90  >90 (mL/min)   C-REACTIVE PROTEIN     Status: Abnormal   Collection Time   03/16/11  5:06 PM      Component Value Range Comment   CRP 2.07 (*) <0.60 (mg/dL)     SEDIMENTATION RATE     Status: Normal   Collection Time   03/16/11  5:06 PM      Component Value Range Comment   Sed Rate 15  0 - 16 (mm/hr)   PROTIME-INR     Status: Normal   Collection Time   03/16/11  5:06 PM      Component Value Range Comment   Prothrombin Time 13.6  11.6 - 15.2 (seconds)    INR 1.02  0.00 - 1.49    MRSA PCR SCREENING     Status: Normal   Collection Time   03/16/11  6:35 PM      Component Value Range Comment   MRSA by PCR NEGATIVE  NEGATIVE     No results found.  Review of Systems  All other systems reviewed and are negative.    Blood pressure 101/63, pulse 69, temperature 97 F (36.1 C), temperature source Oral, resp. rate 18, SpO2 97.00%. Physical Exam  On examination there is cellulitis and approximately a 6 cm area around the medial malleolus the area of his traumatic wound. There is a small drop of purulence this was sent for cultures. The lateral incision and lateral aspect of his ankle shows no signs of cellulitis. Assessment/Plan Assessment: Cellulitis status post open P1 fracture with abscess over the area of the traumatic open wound 2 months status post internal fixation. Plan: We'll plan for admission IV antibiotics routine labs will obtain an MRI scan to see if there is any deep infection will have a PICC line placed for anticipated 6 weeks of IV vancomycin  Retaj Hilbun V 03/17/2011, 6:12 AM

## 2011-03-17 NOTE — Progress Notes (Signed)
Patient ID: Xavier [name], male   DOB: 1960-05-14, 51 y.o.   MRN: [id number] I reviewed the patient's MRI scan this afternoon. He does have an ulcer over the medial malleolus but there are no destructive cortical lesions of the bone. There is some edema of the bone but no definite osteomyelitis. Continue IV antibiotics for the weekend and reevaluate his ankle Monday morning if the ulcer does not look much improved I will plan for irrigation debridement and placement of antibiotic beads Monday evening. Patient's cultures from the office are pending and negative to date his nasal swab negative for MRSA.

## 2011-03-17 NOTE — Progress Notes (Signed)
ANTIBIOTIC CONSULT NOTE - FOLLOW UP  Pharmacy Consult for Vancomycin Indication: Foot infection  No Known Allergies  Vital Signs: Temp: 98.3 F (36.8 C) (02/22 1400) BP: 135/82 mmHg (02/22 1400) Pulse Rate: 84  (02/22 1400)  Intake/Output from previous day: 02/21 0701 - 02/22 0700 In: 1660 [P.O.:1360; IV Piggyback:300] Out: 850 [Urine:850]  Labs:  [facility] [place] 03/16/11 1706  WBC 11.7*  HGB 15.4  PLT 263  LABCREA --  CREATININE 0.93   The CrCl is unknown because both a height and weight (above a minimum accepted value) are required for this calculation.   Vancomycin "trough" level reported as 10.5 mcg/ml.  (THIS LEVEL NOT ACCURATE-SEE ASSESSMENT)  Microbiology: Recent Results (from the past 720 hour(s))  MRSA PCR SCREENING     Status: Normal   Collection Time   03/16/11  6:35 PM      Component Value Range Status Comment   MRSA by PCR NEGATIVE  NEGATIVE  Final     Anti-infectives     Start     Dose/Rate Route Frequency Ordered Stop   03/17/11 0200   vancomycin (VANCOCIN) IVPB 1000 mg/200 mL premix        1,000 mg 200 mL/hr over 60 Minutes Intravenous Every 8 hours 03/16/11 1846     03/16/11 1900   vancomycin (VANCOCIN) IVPB 1000 mg/200 mL premix        1,000 mg 200 mL/hr over 60 Minutes Intravenous NOW 03/16/11 1846 03/16/11 2100   03/16/11 1645  piperacillin-tazobactam (ZOSYN) IVPB 3.375 g       3.375 g 12.5 mL/hr over 240 Minutes Intravenous Every 8 hours 03/16/11 1629            Assessment:  Vancomycin 1 gm q 8 hours scheduled.  The time intervals varied from 12 hours (between 1st and 2nd maintenance doses), to 5 hours (between the 2nd and 3rd maintenance doses).  Vancomycin level drawn around 3rd maintenance dose.  The vancomycin level drawn AFTER  3rd maintenance vancomycin dose documented as having been infusing for ~ 30 minutes.  The results are NOT valid.    Goal of Therapy:   Vancomycin trough 15 - 20 mcg/ml.  Plan:   Reschedule next  Vancomycin dose 8 hours after last dose hung and continue q 8 hour schedule thereafter.  Redraw Vancomycin trough level 2/23 @ 09:00 am, prior to 09:30 am dose.  Chamari Cutbirth, Elisha Headland, Pharm.D. 03/17/2011 7:54 PM

## 2011-03-17 NOTE — Progress Notes (Signed)
Utilization review completed. Raykwon Hobbs, RN, BSN. 03/17/11 

## 2011-03-17 NOTE — Progress Notes (Signed)
Patient ID: Xavier [name], male   DOB: 05/24/1960, 51 y.o.   MRN: [id number] Patient's right medial malleolus ulcer and cellulitis looks much better this morning. There is a very small amount of clear drainage the drainage has resolved. MRI scan scheduled for today IV vancomycin and Zosyn. Plan for placement of PICC line today and anticipate discharge to home on Monday with home IV vancomycin for 6 weeks.

## 2011-03-18 LAB — PROTIME-INR: Prothrombin Time: 14.6 seconds (ref 11.6–15.2)

## 2011-03-18 NOTE — Progress Notes (Signed)
ANTIBIOTIC CONSULT NOTE - FOLLOW UP  Pharmacy Consult for Vancomycin Indication: foot infection  No Known Allergies  Vital Signs: Temp: 97.5 F (36.4 C) (02/23 0650) Pulse Rate: 62  (02/23 0650) Intake/Output from previous day: 02/22 0701 - 02/23 0700 In: 1320 [P.O.:1000; I.V.:320] Out: 401 [Urine:401] Intake/Output from this shift: Total I/O In: 240 [P.O.:240] Out: 800 [Urine:800]  Labs:  [facility] [place] 03/16/11 1706  WBC 11.7*  HGB 15.4  PLT 263  LABCREA --  CREATININE 0.93   Estimated Creatinine Clearance: 116.4 ml/min (by C-G formula based on Cr of 0.93).  Basename 03/18/11 0920 03/17/11 1753  VANCOTROUGH 14.3 10.5  VANCOPEAK -- --  VANCORANDOM -- --  GENTTROUGH -- --  GENTPEAK -- --  GENTRANDOM -- --  TOBRATROUGH -- --  TOBRAPEAK -- --  TOBRARND -- --  AMIKACINPEAK -- --  AMIKACINTROU -- --  AMIKACIN -- --     Microbiology: Recent Results (from the past 720 hour(s))  MRSA PCR SCREENING     Status: Normal   Collection Time   03/16/11  6:35 PM      Component Value Range Status Comment   MRSA by PCR NEGATIVE  NEGATIVE  Final     Anti-infectives     Start     Dose/Rate Route Frequency Ordered Stop   03/17/11 0200   vancomycin (VANCOCIN) IVPB 1000 mg/200 mL premix        1,000 mg 200 mL/hr over 60 Minutes Intravenous Every 8 hours 03/16/11 1846     03/16/11 1900   vancomycin (VANCOCIN) IVPB 1000 mg/200 mL premix        1,000 mg 200 mL/hr over 60 Minutes Intravenous NOW 03/16/11 1846 03/16/11 2100   03/16/11 1645  piperacillin-tazobactam (ZOSYN) IVPB 3.375 g       3.375 g 12.5 mL/hr over 240 Minutes Intravenous Every 8 hours 03/16/11 1629           Assessment: 50yom continues on Vancomycin/Zosyn day#3 for ankle cellulitis. MRI negative for osteo. Outpatient cultures pending. Vancomycin trough this AM was drawn correctly and is therapeutic.  Goal of Therapy:  Vancomycin trough level 10-15 mcg/ml  Plan:  1) Continue Vancomycin 1g q8 2) Continue  Zosyn 3.375g q8  Fredrik Rigger 03/18/2011,12:10 PM

## 2011-03-18 NOTE — Progress Notes (Signed)
Pt stable Incision dressing ok Toes mobile - perfused Awaiting cx results Would wait on picc line until Monday

## 2011-03-19 LAB — PROTIME-INR
INR: 1.07 (ref 0.00–1.49)
Prothrombin Time: 14.1 seconds (ref 11.6–15.2)

## 2011-03-19 MED ORDER — PIPERACILLIN-TAZOBACTAM 3.375 G IVPB
3.3750 g | Freq: Three times a day (TID) | INTRAVENOUS | Status: DC
  Administered 2011-03-20: 3.375 g via INTRAVENOUS
  Filled 2011-03-19: qty 50

## 2011-03-19 MED ORDER — VANCOMYCIN HCL IN DEXTROSE 1-5 GM/200ML-% IV SOLN
1000.0000 mg | Freq: Three times a day (TID) | INTRAVENOUS | Status: DC
Start: 2011-03-20 — End: 2011-03-20
  Administered 2011-03-20 (×2): 1000 mg via INTRAVENOUS
  Filled 2011-03-19 (×2): qty 200

## 2011-03-19 NOTE — Progress Notes (Signed)
Pt stable vss Dressing dry Decision for or based on re eval am by [facility] Cont abx cx results not immediately apparent

## 2011-03-20 LAB — PROTIME-INR
INR: 1.04 (ref 0.00–1.49)
Prothrombin Time: 13.8 seconds (ref 11.6–15.2)

## 2011-03-20 MED ORDER — SODIUM CHLORIDE 0.9 % IJ SOLN: 10.0000 mL | INTRAMUSCULAR | Status: DC | PRN

## 2011-03-20 NOTE — Progress Notes (Signed)
Patient ID: Xavier [name], male   DOB: 1960/09/16, 51 y.o.   MRN: [id number] The cellulitis on the medial malleolar ulcer has completely resolved. The drainage has stopped. The wound is clean and dry. I will check the cultures obtained from my office. If this is not MRSA we will discharge the patient home on IV vancomycin. Plan for a PICC line placement today. Patient refused a PICC line placement on Friday thinking that he may be able to be discharged without IV antibiotics.

## 2011-03-20 NOTE — Progress Notes (Signed)
CARE MANAGEMENT NOTE 03/20/2011  Subjective/Objective Assessment:   51 yr old s/p right ankle infection     Action/Plan:   Discharge planning. Pt is long term patient of [facility] and choses to use them. Entered in [facility].   Anticipated DC Date:  03/20/2011   Anticipated DC Plan:  HOME W HOME HEALTH SERVICES      DC Planning Services  CM consult      [facility] Choice  HOME HEALTH   Choice offered to / List presented to:  C-1 Patient   DME arranged  NA      DME agency  NA     HH arranged  HH-1 RN      Status of service:  Completed, signed off   Discharge Disposition:  HOME W HOME HEALTH SERVICES

## 2011-03-20 NOTE — Discharge Summary (Signed)
Physician Discharge Summary  Patient ID: Xavier [name] MRN: [id number] DOB/AGE: March 03, 1960 51 y.o.  Admit date: 03/16/2011 Discharge date: 03/20/2011  Admission Diagnoses: Right ankle infection and abscess.  Discharge Diagnoses: Right ankle infection and abscess. Active Problems:  * No active hospital problems. *    Discharged Condition: stable  Hospital Course: Patient's hospital course was essentially unremarkable he was admitted central PICC line was placed patient's initial evaluation showed an infection with a purulent abscess over the medial malleolus at the area of his previous open wound 2 months status post ORIF for a [place] fracture. Patient was admitted start IV vancomycin. Patient has a history of multiple MRSA infections. After review of all of his labs from the office his cultures were all negative. The wound looked incredibly better with no drainage the wound has closed the cellulitis had resolved on IV vancomycin. Patient is scheduled for discharge on IV vancomycin for 6 weeks patient's family [facility] her home health antibiotic treatment.  Consults: None  Significant Diagnostic Studies: labs: Routine labs  Treatments: antibiotics: vancomycin and Zosyn and procedures: PICC line  Discharge Exam: Blood pressure 123/61, pulse 61, temperature 97.5 F (36.4 C), temperature source Oral, resp. rate 20, height 6' (1.829 m), weight 95.255 kg (210 lb), SpO2 98.00%. Incision/Wound: the wound cellulitis completely resolved during his hospital stay the drainage result there was no purulence no cellulitis no signs of infection at time of discharge  Disposition: 01-Home or Self Care  Discharge Orders    Future Orders Please Complete By Expires   Diet - low sodium heart healthy      Call MD / Call 911      Comments:   If you experience chest pain or shortness of breath, CALL 911 and be transported to the hospital emergency room.  If you develope a fever  above 101 F, pus (white drainage) or increased drainage or redness at the wound, or calf pain, call your surgeon's office.   Constipation Prevention      Comments:   Drink plenty of fluids.  Prune juice may be helpful.  You may use a stool softener, such as Colace (over the counter) 100 mg twice a day.  Use MiraLax (over the counter) for constipation as needed.   Increase activity slowly as tolerated      Weight Bearing as taught in Physical Therapy      Comments:   Use a walker or crutches as instructed.     Medication List  As of 03/20/2011  7:22 PM   TAKE these medications         ciprofloxacin 500 MG tablet   Commonly known as: CIPRO   Take 500 mg by mouth 2 (two) times daily. Stop date is unknown      doxycycline 100 MG tablet   Commonly known as: VIBRA-TABS   Take 100 mg by mouth 2 (two) times daily. Stop date is unknown      lansoprazole 30 MG capsule   Commonly known as: PREVACID   Take 30 mg by mouth daily.      simvastatin 40 MG tablet   Commonly known as: ZOCOR   Take 40 mg by mouth at bedtime.           Follow-up Information    Follow up with Nadara Mustard, MD.   Contact information:   [address] [place] [address] [phone]          Signed: Nadara Mustard  03/20/2011, 7:22 PM

## 2011-10-01 ENCOUNTER — Emergency Department (HOSPITAL_BASED_OUTPATIENT_CLINIC_OR_DEPARTMENT_OTHER)
Admission: EM | Admit: 2011-10-01 | Discharge: 2011-10-01 | Disposition: A | Discharge status: Home / Self Care | Payer: BC Managed Care – PPO | Attending: Emergency Medicine | Admitting: Emergency Medicine

## 2011-10-01 ENCOUNTER — Encounter (HOSPITAL_BASED_OUTPATIENT_CLINIC_OR_DEPARTMENT_OTHER): Payer: Self-pay | Admitting: *Deleted

## 2011-10-01 DIAGNOSIS — I10 Essential (primary) hypertension: Secondary | ICD-10-CM | POA: Insufficient documentation

## 2011-10-01 DIAGNOSIS — E7800 Pure hypercholesterolemia, unspecified: Secondary | ICD-10-CM | POA: Insufficient documentation

## 2011-10-01 DIAGNOSIS — T1590XA Foreign body on external eye, part unspecified, unspecified eye, initial encounter: Secondary | ICD-10-CM | POA: Insufficient documentation

## 2011-10-01 DIAGNOSIS — Z87891 Personal history of nicotine dependence: Secondary | ICD-10-CM | POA: Insufficient documentation

## 2011-10-01 MED ORDER — GENTAMICIN SULFATE 0.3 % OP SOLN: 1.0000 [drp] | OPHTHALMIC | Status: AC

## 2011-10-01 MED ORDER — HYDROCODONE-ACETAMINOPHEN 5-325 MG PO TABS: 2.0000 | ORAL_TABLET | ORAL | Status: AC | PRN

## 2011-10-01 MED ORDER — TETRACAINE HCL 0.5 % OP SOLN
2.0000 [drp] | Freq: Once | OPHTHALMIC | Status: DC
  Filled 2011-10-01 (×2): qty 2

## 2011-10-01 MED ORDER — FLUORESCEIN SODIUM 1 MG OP STRP
ORAL_STRIP | OPHTHALMIC | Status: AC
  Filled 2011-10-01: qty 1

## 2011-10-01 NOTE — ED Notes (Signed)
Pt states he has a piece of grinding wheel or metal in his left eye. Happened yesterday. No visual problems. [facility]

## 2011-10-01 NOTE — ED Provider Notes (Signed)
History     CSN: [id number]  Arrival date & time 10/01/11  1339   First MD Initiated Contact with Patient 10/01/11 1727      Chief Complaint  Patient presents with  . Foreign Body in Eye    (Consider location/radiation/quality/duration/timing/severity/associated sxs/prior treatment) The history is provided by the patient, the spouse and medical records.    Xavier [name] is a 51 y.o. male presents to the emergency department complaining of metal in his eye.  The onset of the symptoms was  abrupt starting 4 hours ago.  The patient has associated pain in the L eye.  The symptoms have been  persistent, stabilized.  nothing makes the symptoms worse and nothing makes symptoms better.  The patient denies fever, chills, headache, nausea, vomiting, diarrhea, changes in vision.  Patient states he was grinding metal when he felt something fly into his eye. He went to the clinic at [facility] and was told if his mental menisci was sent here to the emergency department he states he has minimal pain in his eye; he does have irritation.  Past Medical History  Diagnosis Date  . Hypertension   . Hypercholesteremia     Past Surgical History  Procedure Date  . Orif ankle fracture 01/16/2011    Procedure: OPEN REDUCTION INTERNAL FIXATION (ORIF) ANKLE FRACTURE;  Surgeon: Nadara Mustard, MD;  Location: [facility] OR;  Service: Orthopedics;  Laterality: Right;  open reduction and internal fixation tib/fib fracture  . Fracture surgery 01/16/11    right    History reviewed. No pertinent family history.  History  Substance Use Topics  . Smoking status: Former Smoker -- 1.0 packs/day for 15 years    Types: Cigarettes  . Smokeless tobacco: Never Used  . Alcohol Use: 12.6 oz/week    7 Cans of beer, 14 Glasses of wine per week      Review of Systems  Constitutional: Negative for fever, diaphoresis, appetite change, fatigue and unexpected weight change.  HENT: Negative for mouth sores and neck stiffness.   Eyes:  Positive for pain and redness. Negative for visual disturbance.  Respiratory: Negative for cough, chest tightness, shortness of breath and wheezing.   Cardiovascular: Negative for chest pain.  Gastrointestinal: Negative for nausea, vomiting, abdominal pain, diarrhea and constipation.  Genitourinary: Negative for dysuria, urgency, frequency and hematuria.  Skin: Negative for rash.  Neurological: Negative for syncope, light-headedness and headaches.  Psychiatric/Behavioral: Negative for disturbed wake/sleep cycle. The patient is not nervous/anxious.     Allergies  Ciprofloxacin and Doxycycline  Home Medications   Current Outpatient Rx  Name Route Sig Dispense Refill  . CO Q 10 PO Oral Take 1 capsule by mouth daily.    Marland Kitchen LANSOPRAZOLE 30 MG PO CPDR Oral Take 30 mg by mouth daily.      Marland Kitchen MILK THISTLE PO Oral Take 3 capsules by mouth daily.    . ADULT MULTIVITAMIN W/MINERALS CH Oral Take 1 tablet by mouth daily.    Marland Kitchen SIMVASTATIN 40 MG PO TABS Oral Take 40 mg by mouth at bedtime.      . TETRAHYDROZOLINE HCL 0.05 % OP SOLN Both Eyes Place 3-4 drops into both eyes 3 (three) times daily as needed. For eye irritation    . GENTAMICIN SULFATE 0.3 % OP SOLN Left Eye Place 1 drop into the left eye every 4 (four) hours. 5 mL 0  . HYDROCODONE-ACETAMINOPHEN 5-325 MG PO TABS Oral Take 2 tablets by mouth every 4 (four) hours as needed for  pain. 6 tablet 0    BP 138/91  Pulse 53  Temp 97.6 F (36.4 C) (Oral)  Resp 18  Ht 6' (1.829 m)  Wt 210 lb (95.255 kg)  BMI 28.48 kg/m2  SpO2 98%  Physical Exam  Nursing note and vitals reviewed. Constitutional: He appears well-developed and well-nourished. No distress.  HENT:  Head: Normocephalic and atraumatic.  Mouth/Throat: Oropharynx is clear and moist. No oropharyngeal exudate.  Eyes: EOM are normal. No foreign body present in the right eye. Foreign body present in the left eye. Right conjunctiva is not injected. Right conjunctiva has no hemorrhage.  Left conjunctiva is injected. Left conjunctiva has no hemorrhage. No scleral icterus.    Neck: Normal range of motion. Neck supple.  Cardiovascular: Normal rate, regular rhythm and intact distal pulses.   Pulmonary/Chest: Effort normal and breath sounds normal. No respiratory distress. He has no wheezes.  Musculoskeletal: Normal range of motion. He exhibits no edema.  Neurological: He is alert.       Speech is clear and goal oriented Moves extremities without ataxia  Skin: Skin is warm and dry. He is not diaphoretic.  Psychiatric: He has a normal mood and affect.    ED Course  Procedures (including critical care time)  Labs Reviewed - No data to display No results found.   1. Foreign body, eye       MDM  Xavier [name] presents with foreign body in his left eye.  He appears to be a piece of metal. Patient discussed with Dr. Preston Fleeting. Dr. Preston Fleeting removed the metal with a slit lamp.  States there is a rest ring that remains.  Patient up-to-date on his tetanus.  Will discharge home with pain medication and ophthalmic antibiotics. I have discussed the importance of following up with ophthalmology.  I have also discussed reasons to return immediately to the ER.  Patient expresses understanding and agrees with plan.  1. Medications: Gentamicin, norco 2. Treatment: Rest, take pain medication as prescribed, cool compress for the eye 3. Follow Up: Followup with ophthalmology         Dierdre Forth, PA-C 10/02/11 0132

## 2011-10-01 NOTE — ED Provider Notes (Signed)
Slit-lamp examination showed 2 foreign bodies in the eye. After anesthesia with tetracaine, they were removed with an 18-gauge needle. Residual rust ring was noted in one location. He will be referred to ophthalmology for rust ring removal as an outpatient.  Medical screening examination/treatment/procedure(s) were conducted as a shared visit with non-physician practitioner(s) and myself.  I personally evaluated the patient during the encounter   Dione Booze, MD 10/01/11 [phone]

## 2012-04-07 ENCOUNTER — Emergency Department (HOSPITAL_COMMUNITY): Payer: BC Managed Care – PPO

## 2012-04-07 ENCOUNTER — Emergency Department (HOSPITAL_COMMUNITY)
Admission: EM | Admit: 2012-04-07 | Discharge: 2012-04-07 | Disposition: A | Discharge status: Home / Self Care | Payer: BC Managed Care – PPO | Attending: Emergency Medicine | Admitting: Emergency Medicine

## 2012-04-07 ENCOUNTER — Encounter (HOSPITAL_COMMUNITY): Payer: Self-pay

## 2012-04-07 DIAGNOSIS — K219 Gastro-esophageal reflux disease without esophagitis: Secondary | ICD-10-CM | POA: Insufficient documentation

## 2012-04-07 DIAGNOSIS — E785 Hyperlipidemia, unspecified: Secondary | ICD-10-CM | POA: Insufficient documentation

## 2012-04-07 DIAGNOSIS — E7800 Pure hypercholesterolemia, unspecified: Secondary | ICD-10-CM | POA: Insufficient documentation

## 2012-04-07 DIAGNOSIS — R42 Dizziness and giddiness: Secondary | ICD-10-CM | POA: Insufficient documentation

## 2012-04-07 LAB — COMPREHENSIVE METABOLIC PANEL WITH GFR
ALT: 74 U/L — ABNORMAL HIGH (ref 0–53)
AST: 34 U/L (ref 0–37)
Albumin: 4 g/dL (ref 3.5–5.2)
Alkaline Phosphatase: 68 U/L (ref 39–117)
BUN: 9 mg/dL (ref 6–23)
CO2: 26 meq/L (ref 19–32)
Calcium: 9.5 mg/dL (ref 8.4–10.5)
Chloride: 102 meq/L (ref 96–112)
Creatinine, Ser: 0.91 mg/dL (ref 0.50–1.35)
GFR calc Af Amer: >90 mL/min
GFR calc non Af Amer: >90 mL/min
Glucose, Bld: 104 mg/dL — ABNORMAL HIGH (ref 70–99)
Potassium: 4.4 meq/L (ref 3.5–5.1)
Sodium: 136 meq/L (ref 135–145)
Total Bilirubin: 0.6 mg/dL (ref 0.3–1.2)
Total Protein: 7.3 g/dL (ref 6.0–8.3)

## 2012-04-07 LAB — CBC WITH DIFFERENTIAL/PLATELET
Eosinophils Relative: 2 % (ref 0–5)
HCT: 41.9 % (ref 39.0–52.0)
Hemoglobin: 15.5 g/dL (ref 13.0–17.0)
Lymphocytes Relative: 21 % (ref 12–46)
Lymphs Abs: 2.3 10*3/uL (ref 0.7–4.0)
MCV: 83 fL (ref 78.0–100.0)
Monocytes Relative: 7 % (ref 3–12)
Platelets: 171 10*3/uL (ref 150–400)
RBC: 5.05 MIL/uL (ref 4.22–5.81)
WBC: 10.9 10*3/uL — ABNORMAL HIGH (ref 4.0–10.5)

## 2012-04-07 MED ORDER — LORAZEPAM 1 MG PO TABS
1.0000 mg | ORAL_TABLET | Freq: Once | ORAL | Status: AC
  Administered 2012-04-07: 1 mg via ORAL
  Filled 2012-04-07: qty 1

## 2012-04-07 NOTE — ED Notes (Signed)
Patient transported to MRI 

## 2012-04-07 NOTE — ED Notes (Signed)
AT 11:00 am began having dizziness difficulty expressing thoughts, Resolved after 10 minutes.  [facility] and it was 170/100.Marland Kitchen   Pt. Denies any symptoms at present time.  Intermittent dizziness

## 2012-04-07 NOTE — ED Provider Notes (Signed)
History     CSN: [id number]  Arrival date & time 04/07/12  1239   First MD Initiated Contact with Patient 04/07/12 1244      Chief Complaint  Patient presents with  . Hypertension    (Consider location/radiation/quality/duration/timing/severity/associated sxs/prior treatment) HPI  Patient reports he was cleaning out the garage today for about an hour. He denies being exposed to any chemicals. He states this is something he's done before without difficulty. He states he started feeling lightheaded and was seeing double and triple vision today and often on the past week. He reports he's been lightheaded off and on for the last couple weeks and it lasts 30-60 seconds. He states he did have a right-sided headache that is gone now. He reports he has not eaten today however he normally does not eat until noontime. He did take 2 regular aspirin at home prior to coming to the ED. He denies chest pain or shortness of breath. He however does states he had some difficulty walking and he was having difficulty saying what he wanted to say. His wife states she did not see any slurred speech or asymmetry of his face. She states he was pale and clammy and states that lasted about 10 minutes. He states last week he stood up after eating dinner and he felt dizzy and lightheaded and had to sit on the steps however he states he did have an episode of fainting. He states he also notices he gets very lightheaded he was up heavy things. However he also notices if he eats he feels better. He states today he [facility] and his blood pressure was 190/110, EMS was called to bring him to the emergency department.  PCP Dr. Eldred Manges at [facility] in [place]  Past Medical History  Diagnosis Date  . Hypertension   . Hypercholesteremia     Past Surgical History  Procedure Laterality Date  . Orif ankle fracture  01/16/2011    Procedure: OPEN REDUCTION INTERNAL FIXATION (ORIF) ANKLE FRACTURE;   Surgeon: Nadara Mustard, MD;  Location: [facility] OR;  Service: Orthopedics;  Laterality: Right;  open reduction and internal fixation tib/fib fracture  . Fracture surgery  01/16/11    right    No family history on file.  Mother of patient has Alzheimer's Father patient is alive at age 67, he has had hypertension, vasovagal syncope and strokes  History  Substance Use Topics  . Smoking status: Former Smoker -- 1.00 packs/day for 15 years    Types: Cigarettes  . Smokeless tobacco: Never Used  . Alcohol Use: 12.6 oz/week    7 Cans of beer, 14 Glasses of wine per week   Lives at home Lives with his wife   Review of Systems  All other systems reviewed and are negative.    Allergies  Ciprofloxacin and Doxycycline  Home Medications   Current Outpatient Rx  Name  Route  Sig  Dispense  Refill  . aspirin 325 MG tablet   Oral   Take 650 mg by mouth once.         . Coenzyme Q10 (CO Q 10 PO)   Oral   Take 1 capsule by mouth at bedtime.          . lansoprazole (PREVACID) 30 MG capsule   Oral   Take 30 mg by mouth at bedtime.          Marland Kitchen MILK THISTLE PO   Oral   Take 3 capsules by  mouth daily.         . Multiple Vitamin (MULTIVITAMIN WITH MINERALS) TABS   Oral   Take 1 tablet by mouth daily.         . simvastatin (ZOCOR) 40 MG tablet   Oral   Take 40 mg by mouth at bedtime.           Marland Kitchen tetrahydrozoline (VISINE) 0.05 % ophthalmic solution   Both Eyes   Place 3-4 drops into both eyes 3 (three) times daily as needed. For eye irritation           BP 154/87  Pulse 63  Temp(Src) 97.7 F (36.5 C) (Oral)  Resp 12  SpO2 97%  Vital signs normal hypertension   Physical Exam  Nursing note and vitals reviewed. Constitutional: He is oriented to person, place, and time. He appears well-developed and well-nourished.  Non-toxic appearance. He does not appear ill. No distress.  HENT:  Head: Normocephalic and atraumatic.  Right Ear: External ear normal.  Left Ear:  External ear normal.  Nose: Nose normal. No mucosal edema or rhinorrhea.  Mouth/Throat: Oropharynx is clear and moist and mucous membranes are normal. No dental abscesses or edematous.  Eyes: Conjunctivae and EOM are normal. Pupils are equal, round, and reactive to light.  Neck: Normal range of motion and full passive range of motion without pain. Neck supple.  Cardiovascular: Normal rate, regular rhythm and normal heart sounds.  Exam reveals no gallop and no friction rub.   No murmur heard. Pulmonary/Chest: Effort normal and breath sounds normal. No respiratory distress. He has no wheezes. He has no rhonchi. He has no rales. He exhibits no tenderness and no crepitus.  Abdominal: Soft. Normal appearance and bowel sounds are normal. He exhibits no distension. There is no tenderness. There is no rebound and no guarding.  Musculoskeletal: Normal range of motion. He exhibits no edema and no tenderness.  Moves all extremities well.   Neurological: He is alert and oriented to person, place, and time. He has normal strength. No cranial nerve deficit.  Finger to nose normal bilaterally, coordinated for her heel to shin bilaterally, grip is equal, no pronator drift, equal motor strength in lower extremities. No nystatin is noted.  Skin: Skin is warm, dry and intact. No rash noted. No erythema. No pallor.  Psychiatric: He has a normal mood and affect. His speech is normal and behavior is normal. His mood appears not anxious.    ED Course  Procedures (including critical care time)  Medications  LORazepam (ATIVAN) tablet 1 mg (1 mg Oral Given 04/07/12 1337)     16:00 Dr Thad Ranger, neurology will see patient.   16:02 BP 143/89  17:40 Dr Thad Ranger has reviewed his MRI and talked to patient. She feels he is not having a neurologic problem, suggests he take a 81 mg aspirin a day.   Results for orders placed during the hospital encounter of 04/07/12  CBC WITH DIFFERENTIAL      Result Value Range   WBC  10.9 (*) 4.0 - 10.5 K/uL   RBC 5.05  4.22 - 5.81 MIL/uL   Hemoglobin 15.5  13.0 - 17.0 g/dL   HCT 04.5  40.9 - 81.1 %   MCV 83.0  78.0 - 100.0 fL   MCH 30.7  26.0 - 34.0 pg   MCHC 37.0 (*) 30.0 - 36.0 g/dL   RDW 91.4  78.2 - 95.6 %   Platelets 171  150 - 400 K/uL   Neutrophils Relative  70  43 - 77 %   Neutro Abs 7.7  1.7 - 7.7 K/uL   Lymphocytes Relative 21  12 - 46 %   Lymphs Abs 2.3  0.7 - 4.0 K/uL   Monocytes Relative 7  3 - 12 %   Monocytes Absolute 0.7  0.1 - 1.0 K/uL   Eosinophils Relative 2  0 - 5 %   Eosinophils Absolute 0.2  0.0 - 0.7 K/uL   Basophils Relative 0  0 - 1 %   Basophils Absolute 0.0  0.0 - 0.1 K/uL  COMPREHENSIVE METABOLIC PANEL      Result Value Range   Sodium 136  135 - 145 mEq/L   Potassium 4.4  3.5 - 5.1 mEq/L   Chloride 102  96 - 112 mEq/L   CO2 26  19 - 32 mEq/L   Glucose, Bld 104 (*) 70 - 99 mg/dL   BUN 9  6 - 23 mg/dL   Creatinine, Ser 9.14  0.50 - 1.35 mg/dL   Calcium 9.5  8.4 - 78.2 mg/dL   Total Protein 7.3  6.0 - 8.3 g/dL   Albumin 4.0  3.5 - 5.2 g/dL   AST 34  0 - 37 U/L   ALT 74 (*) 0 - 53 U/L   Alkaline Phosphatase 68  39 - 117 U/L   Total Bilirubin 0.6  0.3 - 1.2 mg/dL   GFR calc non Af Amer >90  >90 mL/min   GFR calc Af Amer >90  >90 mL/min   Laboratory interpretation all normal except leukocytosis    Mr Brain Wo Contrast  04/07/2012  *RADIOLOGY REPORT*  Clinical Data: Hypertension.  Double vision.  Gait disturbance.  MRI HEAD WITHOUT CONTRAST  Technique:  Multiplanar, multiecho pulse sequences of the brain and surrounding structures were obtained according to standard protocol without intravenous contrast.  Comparison: None.  Findings: Diffusion imaging does not show any acute or subacute infarction.  There are a few punctate foci of T2 and FLAIR signal within the hemispheric white matter consistent with old small vessel insults.  No cortical or large vessel territory infarction. No mass lesion, hemorrhage, hydrocephalus or  extra-axial collection.  No pituitary mass.  No inflammatory sinus disease.  No skull or skull base lesion.  IMPRESSION: No acute or likely significant finding.  Mild small vessel changes of the hemispheric white matter that look chronic.   Original Report Authenticated By: Paulina Fusi, M.D.      1. Dizziness    Plan discharge  Devoria Albe, MD, FACEP    MDM          Ward Givens, MD 04/07/12 [id number]

## 2012-04-07 NOTE — Consult Note (Signed)
Reason for Consult: dizziness / presyncope Referring Physician: Dr. Lynelle Doctor  CC: Dizziness  HPI: Xavier [name] is a pleasant 52 y.o. male with a history of hyperlipidemia, remote tobacco history, borderline hypertension, and gastroesophageal reflux disease. Today he was working in his garage at home when he suddenly became dizzy, clammy, and diaphoretic. He went into the house and told his wife who gave him some juice to drink. He went into the bathroom to take an aspirin, thinking he might be having a heart attack, and noticed that he was experiencing multiples in his vision as well. He and his wife went [facility] a [facility]" where the patient states his blood pressure was extremely elevated. He believes systolic pressure was greater than 190 and possibly as high as 200. He states he has a history of borderline hypertension but has not been on medication for this problemThey came to the emergency department at [facility] where he was evaluated. A neurology consult was requested. An MRI has been performed however as the official reading is pending at this time. An EKG showed sinus rhythm rate 67 beats per minute with an incomplete right bundle branch block. The patient denied any chest pain or shortness of breath associated with today's episode. He denied associated headache.  The patient is accompanied by his wife and his part of the history. He did appear to have some difficulty getting some of his words out although this may have been secondary to anxiety. He has been under a lot of stress with work recently and has been concerned about his mothers failing health. He has had several episodes of dizziness over the past several months especially when straining. Today's episode was the worst so far. He feels back to normal now.  Past Medical History  Diagnosis Date  . Hypertension   . Hypercholesteremia     Past Surgical History  Procedure Laterality Date  . Orif ankle fracture  01/16/2011   Procedure: OPEN REDUCTION INTERNAL FIXATION (ORIF) ANKLE FRACTURE;  Surgeon: Nadara Mustard, MD;  Location: [facility] OR;  Service: Orthopedics;  Laterality: Right;  open reduction and internal fixation tib/fib fracture  . Fracture surgery  01/16/11    right    Family history: The patient's parents live in [place]. His father is age 23 and has hypertension, coronary artery disease, a history of syncope, and a history of CVAs. His mother is in her early 41s and has Alzheimer's dementia. He has 2 or sisters who are alive and well  Social History:  reports that he has quit smoking. His smoking use included Cigarettes. He has a 15 pack-year smoking history. He has never used smokeless tobacco. He reports that he drinks about 12.6 ounces of alcohol per week. He reports that he does not use illicit drugs.The patient lives with his wife in [place]. They have one grown son. 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 He drinks several beers as well as several glasses of wine each day  Allergies  Allergen Reactions  . Ciprofloxacin Rash  . Doxycycline Rash    Medications: Simvastatin, generic Prevacid, and supplements. Dosages not available at this time.  ROS: History obtained from the patient  General ROS: negative for - chills, fatigue, fever, night sweats, weight gain or weight loss Psychological ROS: negative for - behavioral disorder, hallucinations, memory difficulties, mood swings or suicidal ideation Ophthalmic ROS: negative for - blurry vision, double vision, eye pain or loss of vision  ENT ROS: negative for - epistaxis, nasal discharge, oral lesions, sore throat, tinnitus or vertigo Allergy and Immunology ROS: negative for - hives or itchy/watery eyes Hematological and Lymphatic ROS: negative for - bleeding problems, bruising or swollen lymph nodes Endocrine ROS: negative for - galactorrhea, hair pattern changes, polydipsia/polyuria or  temperature intolerance Respiratory ROS: negative for - cough, hemoptysis, shortness of breath or wheezing positive for a recent upper respiratory tract infection which has resolved Cardiovascular ROS: negative for - chest pain, dyspnea on exertion, edema or irregular heartbeat Gastrointestinal ROS: negative for - abdominal pain, diarrhea, hematemesis, nausea/vomiting or stool incontinence Genito-Urinary ROS: negative for - dysuria, hematuria, incontinence or urinary frequency/urgency Musculoskeletal ROS: negative for - joint swelling or muscular weakness. The patient had a compound fracture of his right ankle in December of 2012. He had surgical repair; although, he still has some problems with this ankle. Neurological ROS: as noted in HPI Dermatological ROS: negative for rash and skin lesion changes  Physical Examination: Blood pressure 148/79, pulse 59, temperature 97.7 F (36.5 C), temperature source Oral, resp. rate 12, SpO2 97.00%.  General - pleasant 52 year old male in no acute distress Heart - Regular rate and rhythm - no murmer Lungs - Clear to auscultation Abdomen - Soft - non tender Extremities - Distal pulses intact - no edema Skin - Warm and dry  Mental Status: Alert, oriented, thought content appropriate.  Speech fluent without evidence of aphasia.  Able to follow 3 step commands without difficulty. Cranial Nerves: II: Discs not visualized; Visual fields grossly normal, pupils equal, round, reactive to light and accommodation III,IV, VI: ptosis not present, extra-ocular motions intact bilaterally V,VII: smile symmetric, facial light touch sensation normal bilaterally VIII: hearing normal bilaterally IX,X: gag reflex present XI: bilateral shoulder shrug XII: midline tongue extension Motor: Right : Upper extremity   5/5    Left:     Upper extremity   5/5  Lower extremity   5/5     Lower extremity   5/5 Tone and bulk:normal tone throughout; no atrophy noted Sensory:  light touch intact throughout, bilaterally Deep Tendon Reflexes: 2+ and symmetric throughout Plantars: Right: downgoing   Left: downgoing Cerebellar: normal finger-to-nose, normal rapid alternating movements and normal heel-to-shin test Gait: did not ambulate the patient at this time. CV: pulses palpable throughout   Laboratory Studies:   Basic Metabolic Panel:  Recent Labs Lab 04/07/12 1354  NA 136  K 4.4  CL 102  CO2 26  GLUCOSE 104*  BUN 9  CREATININE 0.91  CALCIUM 9.5    Liver Function Tests:  Recent Labs Lab 04/07/12 1354  AST 34  ALT 74*  ALKPHOS 68  BILITOT 0.6  PROT 7.3  ALBUMIN 4.0   No results found for this basename: LIPASE, AMYLASE,  in the last 168 hours No results found for this basename: AMMONIA,  in the last 168 hours  CBC:  Recent Labs Lab 04/07/12 1354  WBC 10.9*  NEUTROABS 7.7  HGB 15.5  HCT 41.9  MCV 83.0  PLT 171    Cardiac Enzymes: No results found for this basename: CKTOTAL, CKMB, CKMBINDEX, TROPONINI,  in the last 168 hours  BNP: No components found with this basename: POCBNP,   CBG: No results found for this basename: GLUCAP,  in the last 168 hours  Microbiology: Results for orders placed during the hospital encounter of 03/16/11  MRSA PCR SCREENING     Status: None   Collection Time    03/16/11  6:35 PM  Result Value Range Status   MRSA by PCR NEGATIVE  NEGATIVE Final   Comment:            The GeneXpert MRSA Assay (FDA     approved for NASAL specimens     only), is one component of a     comprehensive MRSA colonization     surveillance program. It is not     intended to diagnose MRSA     infection nor to guide or     monitor treatment for     MRSA infections.    Coagulation Studies: No results found for this basename: LABPROT, INR,  in the last 72 hours  Urinalysis: No results found for this basename: COLORURINE, APPERANCEUR, LABSPEC, PHURINE, GLUCOSEU, HGBUR, BILIRUBINUR, KETONESUR, PROTEINUR,  UROBILINOGEN, NITRITE, LEUKOCYTESUR,  in the last 168 hours  Lipid Panel:  No results found for this basename: chol, trig, hdl, cholhdl, vldl, ldlcalc    HgbA1C:  No results found for this basename: HGBA1C    Urine Drug Screen:   No results found for this basename: labopia, cocainscrnur, labbenz, amphetmu, thcu, labbarb    Alcohol Level: No results found for this basename: ETH,  in the last 168 hours  Other results: NFA:OZHYQ rhythm rate 67 beats per minute with an incomplete right bundle branch block  Imaging: MRI of the brain: No acute or likely significant finding. Mild small vessel changes  of the hemispheric white matter that look chronic.  Patient seen and examined.  Clinical course and management discussed.  Necessary edits performed.  I agree with the above.  Assessment and plan of care developed and discussed below.    Assessment/Plan: 52 year old male presenting with complaints of dizziness and blurred vision.  Patient does not really describe diplopia but multiple images (4-5) horizontally that move around only when trying to focus.  MRI of the brain unremarkable.  Symptoms now resolved.  Has dizziness with what sounds like Valsalva maneuvers.  Do not expect TIA's.  Recommendations: 1.  ASA 81mg  daily 2.  No further neurologic testing recommended at this time.     Thana Farr, MD [facility] [phone]  04/07/2012  6:46 PM

## 2013-02-18 ENCOUNTER — Encounter (HOSPITAL_COMMUNITY): Payer: Self-pay | Admitting: Emergency Medicine

## 2013-02-18 ENCOUNTER — Emergency Department (HOSPITAL_COMMUNITY): Payer: BC Managed Care – PPO

## 2013-02-18 ENCOUNTER — Emergency Department (HOSPITAL_COMMUNITY)
Admission: EM | Admit: 2013-02-18 | Discharge: 2013-02-18 | Disposition: A | Discharge status: Home / Self Care | Payer: BC Managed Care – PPO | Attending: Emergency Medicine | Admitting: Emergency Medicine

## 2013-02-18 DIAGNOSIS — Z87891 Personal history of nicotine dependence: Secondary | ICD-10-CM | POA: Insufficient documentation

## 2013-02-18 DIAGNOSIS — E7800 Pure hypercholesterolemia, unspecified: Secondary | ICD-10-CM | POA: Insufficient documentation

## 2013-02-18 DIAGNOSIS — R209 Unspecified disturbances of skin sensation: Secondary | ICD-10-CM | POA: Insufficient documentation

## 2013-02-18 DIAGNOSIS — Z79899 Other long term (current) drug therapy: Secondary | ICD-10-CM | POA: Insufficient documentation

## 2013-02-18 DIAGNOSIS — R61 Generalized hyperhidrosis: Secondary | ICD-10-CM | POA: Insufficient documentation

## 2013-02-18 DIAGNOSIS — R079 Chest pain, unspecified: Secondary | ICD-10-CM

## 2013-02-18 DIAGNOSIS — R42 Dizziness and giddiness: Secondary | ICD-10-CM | POA: Insufficient documentation

## 2013-02-18 DIAGNOSIS — R002 Palpitations: Secondary | ICD-10-CM | POA: Insufficient documentation

## 2013-02-18 DIAGNOSIS — F411 Generalized anxiety disorder: Secondary | ICD-10-CM | POA: Insufficient documentation

## 2013-02-18 DIAGNOSIS — R0602 Shortness of breath: Secondary | ICD-10-CM | POA: Insufficient documentation

## 2013-02-18 DIAGNOSIS — I10 Essential (primary) hypertension: Secondary | ICD-10-CM | POA: Insufficient documentation

## 2013-02-18 LAB — BASIC METABOLIC PANEL
BUN: 12 mg/dL (ref 6–23)
CO2: 26 meq/L (ref 19–32)
Calcium: 9.7 mg/dL (ref 8.4–10.5)
Chloride: 98 mEq/L (ref 96–112)
Creatinine, Ser: 0.88 mg/dL (ref 0.50–1.35)
GFR calc Af Amer: >90 mL/min (ref >90)
GLUCOSE: 111 mg/dL — AB (ref 70–99)
POTASSIUM: 4.4 meq/L (ref 3.7–5.3)
SODIUM: 139 meq/L (ref 137–147)

## 2013-02-18 LAB — CBC
HEMATOCRIT: 44.7 % (ref 39.0–52.0)
HEMOGLOBIN: 16.9 g/dL (ref 13.0–17.0)
MCH: 30.9 pg (ref 26.0–34.0)
MCHC: 37.3 g/dL — AB (ref 30.0–36.0)
MCV: 83.2 fL (ref 78.0–100.0)
Platelets: 222 10*3/uL (ref 150–400)
RBC: 5.37 MIL/uL (ref 4.22–5.81)
RDW: 12.9 % (ref 11.5–15.5)
WBC: 11.3 10*3/uL — ABNORMAL HIGH (ref 4.0–10.5)

## 2013-02-18 LAB — PRO B NATRIURETIC PEPTIDE: Pro B Natriuretic peptide (BNP): <5 pg/mL (ref 0–125)

## 2013-02-18 LAB — POCT I-STAT TROPONIN I: TROPONIN I, POC: 0 ng/mL (ref 0.00–0.08)

## 2013-02-18 NOTE — ED Notes (Signed)
Pt reports being at work, at 1100 had episode of chest tightness, sob, diaphoresis, tingling to extremities. Reports that he has these episodes daily and pcp has been treating him for panic attacks.

## 2013-02-18 NOTE — Discharge Instructions (Signed)
Your work-up today was normal. Follow up with your primary care physician within 1 week for re-check and to discuss this ED visit. You may follow up with cardiology if desired for further evaluation. Return to the ED for new or worsening symptoms.

## 2013-02-18 NOTE — ED Provider Notes (Signed)
CSN: [id number]     Arrival date & time 02/18/13  1213 History   First MD Initiated Contact with Patient 02/18/13 1848     Chief Complaint  Patient presents with  . Hypertension  . Dizziness  . Chest Pain   (Consider location/radiation/quality/duration/timing/severity/associated sxs/prior Treatment) The history is provided by the patient and medical records.   This is a 53 year old male with past medical history significant for hypertension, hyperlipidemia, anxiety, presenting to the ED for an episode of chest pain that occurred earlier today while at work.  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  He became worried that he was going to die so he went into the common area with other co-workers. Patient states he has these episodes almost on a daily basis-- has been evaluated multiple times and is currently being treated for panic disorder by his PCP. Patient states he lives in a constant fear that he will have another episode of chest pain or panic attack.  States he also feels that he is somewhat of a hypochondriac-- he feels that every headache is a brain tumor and subsequently has had multiple CT scans and MRIs without any significant findings.  Pts wife notes that he has always been very anxious, but sx seemed to worsen after being started on benicar for HTN by his PCP.  They have discussed this with her, however she refuses to change his medication.  No other recent medication changes.  Pt has no prior hx of CAD or MI.  Pt was former smoker for approx 10 years.  VS stable on arrival.  Past Medical History  Diagnosis Date  . Hypertension   . Hypercholesteremia    Past Surgical History  Procedure Laterality Date  . Orif ankle fracture  01/16/2011    Procedure: OPEN REDUCTION INTERNAL FIXATION  (ORIF) ANKLE FRACTURE;  Surgeon: Nadara Mustard, MD;  Location: [facility] OR;  Service: Orthopedics;  Laterality: Right;  open reduction and internal fixation tib/fib fracture  . Fracture surgery  01/16/11    right   History reviewed. No pertinent family history. History  Substance Use Topics  . Smoking status: Former Smoker -- 1.00 packs/day for 15 years    Types: Cigarettes  . Smokeless tobacco: Never Used  . Alcohol Use: 12.6 oz/week    7 Cans of beer, 14 Glasses of wine per week    Review of Systems  Respiratory: Positive for shortness of breath.   Cardiovascular: Positive for chest pain and palpitations.  Psychiatric/Behavioral: The patient is nervous/anxious.   All other systems reviewed and are negative.    Allergies  Ciprofloxacin and Doxycycline  Home Medications   Current Outpatient Rx  Name  Route  Sig  Dispense  Refill  . lansoprazole (PREVACID) 30 MG capsule   Oral   Take 30 mg by mouth at bedtime.          Marland Kitchen olmesartan (BENICAR) 20 MG tablet   Oral   Take 20 mg by mouth daily.         . sertraline (ZOLOFT) 50 MG tablet   Oral   Take 50 mg by mouth daily.         . simvastatin (ZOCOR) 40 MG tablet   Oral   Take 40 mg by mouth at bedtime.           Marland Kitchen  tetrahydrozoline (VISINE) 0.05 % ophthalmic solution   Both Eyes   Place 3-4 drops into both eyes 3 (three) times daily as needed. For eye irritation          BP 155/91  Pulse 81  Temp(Src) 97.8 F (36.6 C) (Oral)  Resp 12  SpO2 98%  Physical Exam  Nursing note and vitals reviewed. Constitutional: He is oriented to person, place, and time. He appears well-developed and well-nourished. No distress.  HENT:  Head: Normocephalic and atraumatic.  Mouth/Throat: Oropharynx is clear and moist.  Eyes: Conjunctivae and EOM are normal. Pupils are equal, round, and reactive to light.  Neck: Normal range of motion. Neck supple.  Cardiovascular: Normal rate, regular rhythm and normal heart sounds.    Pulmonary/Chest: Effort normal and breath sounds normal. No respiratory distress. He has no wheezes.  Abdominal: Soft. Bowel sounds are normal. There is no tenderness. There is no guarding.  Musculoskeletal: Normal range of motion. He exhibits no edema.  Neurological: He is alert and oriented to person, place, and time.  Skin: Skin is warm and dry. He is not diaphoretic.  Psychiatric: His mood appears anxious.  Extremely anxious; speaking very rapidly and asking multiple times if he is going to die    ED Course  Procedures (including critical care time) Labs Review Labs Reviewed  BASIC METABOLIC PANEL - Abnormal; Notable for the following:    Glucose, Bld 111 (*)    All other components within normal limits  CBC - Abnormal; Notable for the following:    WBC 11.3 (*)    MCHC 37.3 (*)    All other components within normal limits  PRO B NATRIURETIC PEPTIDE  POCT I-STAT TROPONIN I   Imaging Review Dg Chest 2 View  02/18/2013   CLINICAL DATA:  Hypertension with dizziness.  Chest pressure.  EXAM: CHEST  2 VIEW  COMPARISON:  None.  FINDINGS: Cardiopericardial silhouette within normal limits. Mediastinal contours normal. Trachea midline. No airspace disease or effusion.  IMPRESSION: No active cardiopulmonary disease.   Electronically Signed   By: Andreas Newport M.D.   On: 02/18/2013 14:10    EKG Interpretation    Date/Time:  Tuesday February 18 2013 12:42:55 EST Ventricular Rate:  69 PR Interval:  136 QRS Duration: 104 QT Interval:  406 QTC Calculation: 435 R Axis:   54 Text Interpretation:  Normal sinus rhythm Incomplete right bundle branch block Borderline ECG since last tracing no significant change Confirmed by WENTZ  MD, ELLIOTT ([id number]) on 02/18/2013 7:56:22 PM            MDM   1. Chest pain    EKG normal sinus rhythm, no acute ischemic changes. Troponin is negative. Chest x-ray is clear. Labs are reassuring. On exam patient is extremely anxious, asking multiple times  if he is going to die.  Pt does have some cardiac RF (HTN, HLP, former smoker) however at this time i have low suspicion for ACS, PE, dissection, or other acute cardiac event.  His sx earlier today likely due to anxiety. His wife present at bedside admits that he is like this on almost a daily basis. She feels he would benefit from further evaluation by cardiologist to discuss alternative BP meds and further cardiac work-up for his reassurance.  Patient states he feels reassured after his ED evaluation today and is comfortable following up with his primary care physician. I have also provided him with a referral to cardiology should he wish to see them.  Strict  return precautions advised for new or worsening symptoms.  Garlon HatchetLisa M Mahlet Jergens, PA-C 02/18/13 [phone]

## 2013-02-18 NOTE — ED Notes (Signed)
PA at bedside.

## 2013-02-19 IMAGING — CR DG ANKLE PORT 2V*R*
2 series · 2 of 2 positions shown · non-contrast
Comparison: None.

CLINICAL DATA: Open ankle fracture.

PORTABLE RIGHT ANKLE - 2 VIEW

[AP]
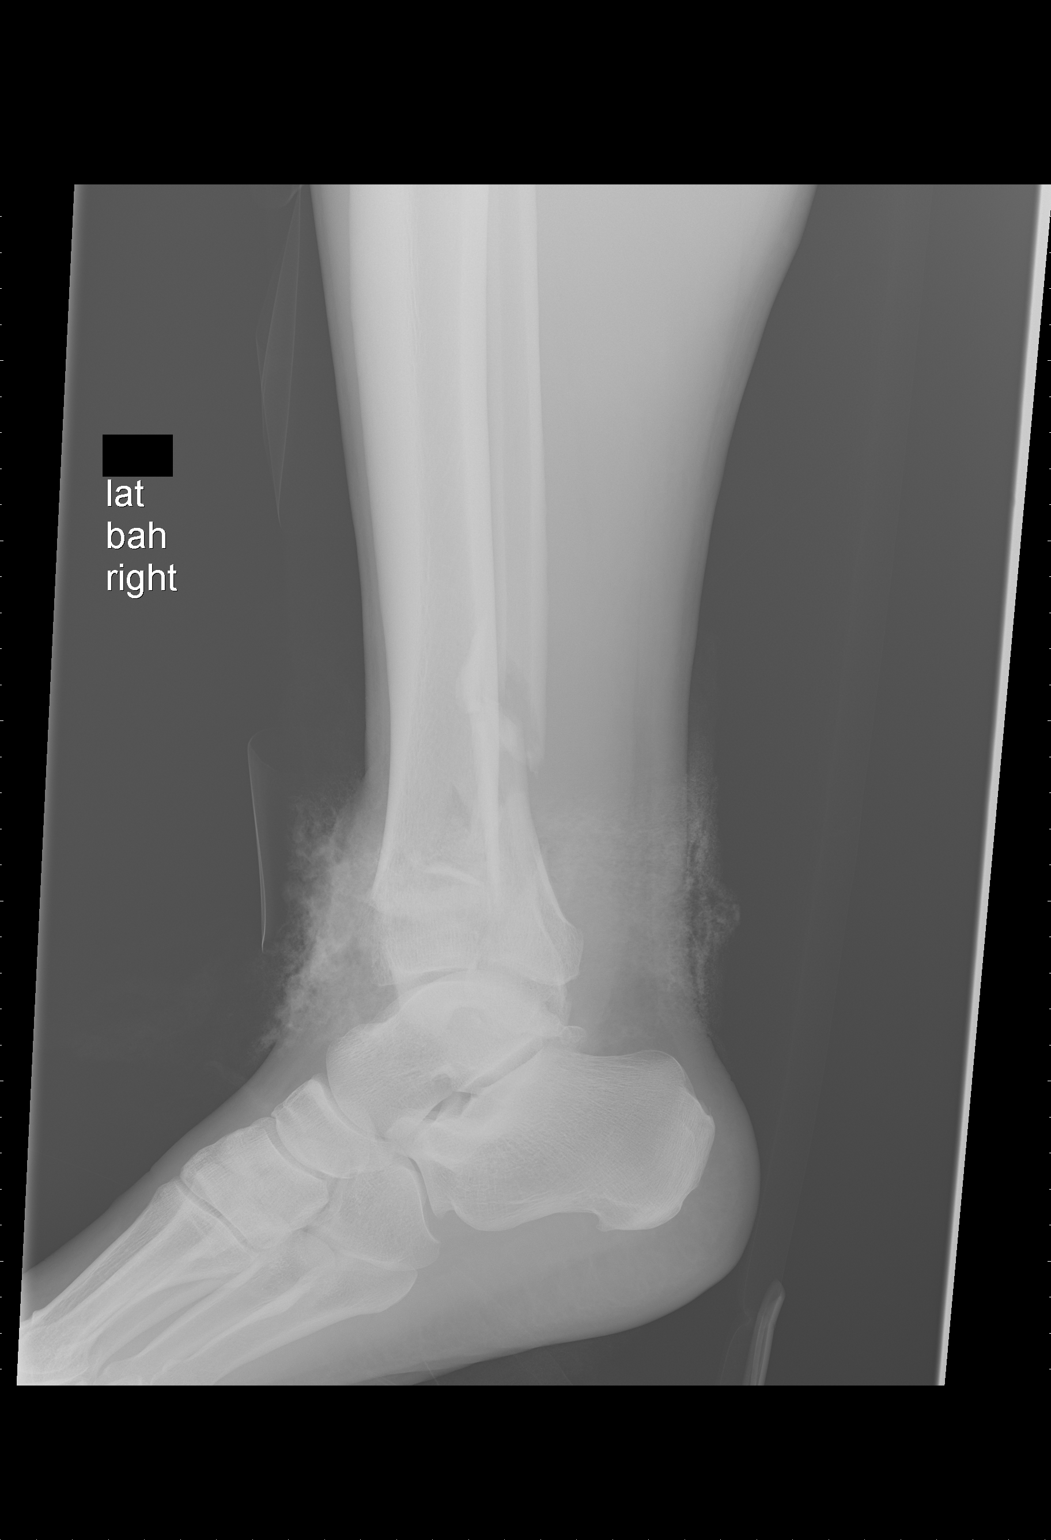

[ankle lat]
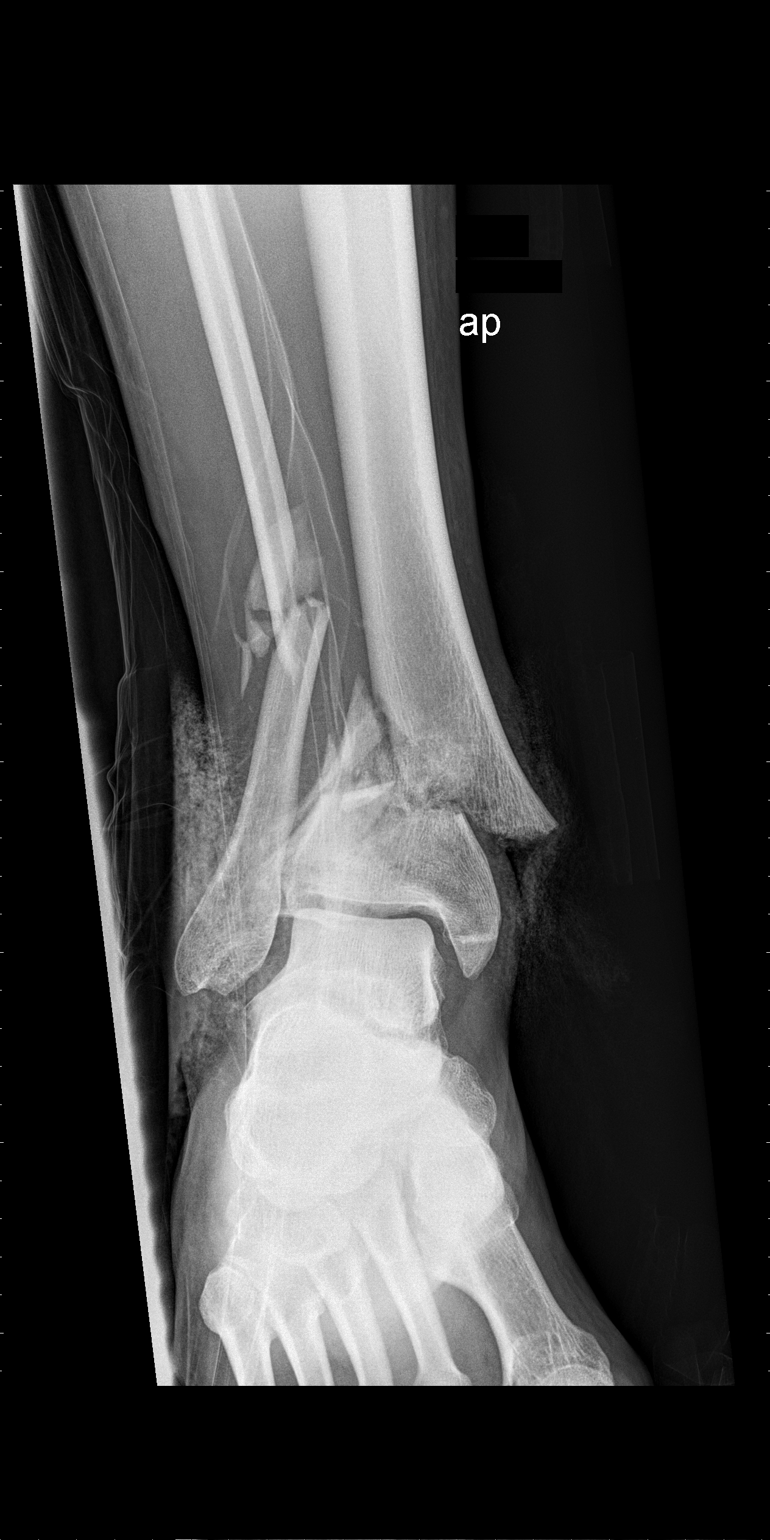

[2 of 2 positions shown; findings below may reference images not displayed]

FINDINGS: Markedly comminuted distal right tibial and fibular
fractures.  The distal tibial shaft appears to disrupt the skin
surface.  The tibial shaft is displaced 2.6 cm medially relative to
the distal fragment.
IMPRESSION: Comminuted, displaced open distal tibial and fibular fractures.

## 2013-02-20 NOTE — ED Provider Notes (Signed)
Medical screening examination/treatment/procedure(s) were performed by non-physician practitioner and as supervising physician I was immediately available for consultation/collaboration.  EKG Interpretation    Date/Time:  Tuesday February 18 2013 12:42:55 EST Ventricular Rate:  69 PR Interval:  136 QRS Duration: 104 QT Interval:  406 QTC Calculation: 435 R Axis:   54 Text Interpretation:  Normal sinus rhythm Incomplete right bundle branch block Borderline ECG since last tracing no significant change Confirmed by Effie ShyWENTZ  MD, Dama Hedgepeth ([id number]) on 02/18/2013 7:56:22 PM             Flint MelterElliott L Alvino Lechuga, MD 02/20/13 [phone]

## 2022-04-27 ENCOUNTER — Ambulatory Visit (INDEPENDENT_AMBULATORY_CARE_PROVIDER_SITE_OTHER): Payer: 59

## 2022-04-27 ENCOUNTER — Other Ambulatory Visit: Payer: Self-pay | Admitting: Podiatry

## 2022-04-27 ENCOUNTER — Ambulatory Visit (INDEPENDENT_AMBULATORY_CARE_PROVIDER_SITE_OTHER): Payer: 59 | Admitting: Podiatry

## 2022-04-27 DIAGNOSIS — M79672 Pain in left foot: Secondary | ICD-10-CM | POA: Diagnosis not present

## 2022-04-27 DIAGNOSIS — M7752 Other enthesopathy of left foot: Secondary | ICD-10-CM | POA: Diagnosis not present

## 2022-04-27 DIAGNOSIS — M109 Gout, unspecified: Secondary | ICD-10-CM

## 2022-04-27 MED ORDER — METHYLPREDNISOLONE 4 MG PO TBPK: ORAL_TABLET | ORAL | 0 refills | Status: AC

## 2022-04-27 MED ORDER — TRIAMCINOLONE ACETONIDE 10 MG/ML IJ SUSP
10.0000 mg | Freq: Once | INTRAMUSCULAR | Status: AC
  Administered 2022-04-27: 10 mg

## 2022-04-27 NOTE — Progress Notes (Signed)
Subjective:   Patient ID: Xavier [name], male   DOB: 62 y.o.   MRN: [id number]   HPI Patient states that he has a lot of throbbing in his left joints that he is not sure what happened and states that he has been on a diet of high-protein and that has changed over the last few months.  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       Objective:  Physical Exam Vitals and nursing note reviewed.  Constitutional:      Appearance: He is well-developed.  Pulmonary:     Effort: Pulmonary effort is normal.  Musculoskeletal:        General: Normal range of motion.  Skin:    General: Skin is warm.  Neurological:     Mental Status: He is alert.     Neurovascular status intact muscle strength adequate range of motion adequate with patient noted to have severe swelling of the second and third metatarsal phalangeal joint left with fluid buildup around those joint surfaces no proximal edema erythema drainage currently noted.  Good digital perfusion well-oriented     Assessment:  Inflammatory capsulitis of the second and third MPJs left with fluid buildup around the joint surfaces and pain within the joints     Plan:  Inflammatory capsulitis of the second and third MPJs left with possibility for gout or other systemic inflammatory condition.  I reviewed this with him reviewed his x-rays and I went ahead today and I anesthetized the left forefoot with 60 mg like Marcaine mixture I aspirated the second and third MPJs getting out a small amount of clear fluid I injected with 1/4 cc dexamethasone Kenalog into each joint and advised on rigid bottom shoes and placed on steroid 6-day Dosepak.  Sent for blood work to look at arthritic markers and reappoint 1 week  X-rays indicate no signs of fracture or other bony pathology associated with this

## 2022-04-28 LAB — COMPREHENSIVE METABOLIC PANEL
AG Ratio: 1.5 (calc) (ref 1.0–2.5)
ALT: 24 U/L (ref 9–46)
AST: 21 U/L (ref 10–35)
Albumin: 4.9 g/dL (ref 3.6–5.1)
Alkaline phosphatase (APISO): 39 U/L (ref 35–144)
BUN: 23 mg/dL (ref 7–25)
CO2: 26 mmol/L (ref 20–32)
Calcium: 10.4 mg/dL — ABNORMAL HIGH (ref 8.6–10.3)
Chloride: 97 mmol/L — ABNORMAL LOW (ref 98–110)
Creat: 1.12 mg/dL (ref 0.70–1.30)
Globulin: 3.3 g/dL (calc) (ref 1.9–3.7)
Glucose, Bld: 98 mg/dL (ref 65–99)
Potassium: 4.7 mmol/L (ref 3.5–5.3)
Sodium: 136 mmol/L (ref 135–146)
Total Bilirubin: 0.8 mg/dL (ref 0.2–1.2)
Total Protein: 8.2 g/dL — ABNORMAL HIGH (ref 6.1–8.1)

## 2022-04-28 LAB — RHEUMATOID FACTOR: Rhuematoid fact SerPl-aCnc: <14 IU/mL (ref <14)

## 2022-04-28 LAB — SEDIMENTATION RATE: Sed Rate: 22 mm/h — ABNORMAL HIGH (ref 0–20)

## 2022-04-28 LAB — URIC ACID: Uric Acid, Serum: 9.1 mg/dL — ABNORMAL HIGH (ref 4.0–8.0)

## 2022-04-28 LAB — C-REACTIVE PROTEIN: CRP: 4.9 mg/L (ref <8.0)

## 2022-05-04 ENCOUNTER — Ambulatory Visit (INDEPENDENT_AMBULATORY_CARE_PROVIDER_SITE_OTHER): Payer: 59 | Admitting: Podiatry

## 2022-05-04 DIAGNOSIS — M7752 Other enthesopathy of left foot: Secondary | ICD-10-CM

## 2022-05-04 DIAGNOSIS — M109 Gout, unspecified: Secondary | ICD-10-CM | POA: Diagnosis not present

## 2022-05-04 NOTE — Progress Notes (Signed)
Subjective:   Patient ID: Xavier [name], male   DOB: 62 y.o.   MRN: [id number]   HPI Patient states he is feeling significantly better after the injections that we did last week patient wanted to review blood work   ROS      Objective:  Physical Exam  Neuro vascular status intact significant diminishment of swelling discomfort in the dorsal left second and third metatarsal phalangeal joint with no other pathology noted currently     Assessment:  Strong possibility for gout with probable change to a protein diet weight loss and other issues contributory     Plan:  H&P reviewed at great length recommended heat therapy and discussed at great length gout and we are going to hold off on medications but may be necessary someday and today I did go ahead and I want him to repeat the blood work in several months and I gave him sheets that talks about foods to avoid and general information about

## 2022-05-04 NOTE — Patient Instructions (Signed)
Goutte Gout  La goutte est une maladie qui provoque un gonflement douloureux des articulations. La goutte est un type d'inflammation des articulations (arthrite). Un excs d'acide urique dans l'organisme est  l'origine de cette affection. L'acide urique est un compos chimique qui se forme lorsque l'organisme fractionne des substances appeles purines. Les purines sont importantes pour la constitution des protines de l'organisme. Lorsque l'organisme contient trop d'acide urique, des cristaux Print production planner se former et Diplomatic Services operational officer. Cela provoque des douleurs et [employer]. Les crises de [employer] se produire rapidement et tre trs douloureuses (goutte aigu). Au fil du temps, les attaques peuvent affecter davantage d'articulations et devenir plus frquentes (goutte chronique). La goutte peut galement provoquer l'accumulation d'acide urique sous la peau et  Environmental education officer. Quelles sont les causes ? Un excs d'acide [employer]AA sang est  l'origine de cette affection. Cela peut se produire si : Vos reins n'liminent pas suffisamment d'acide urique de votre sang. C'est la cause la plus frquente. Votre organisme produit trop d'acide urique. Cela peut arriver [place] Diones certains cancers et certains traitements contre le cancer. Cela peut galement se produire si votre organisme dtruit massivement les globules rouges (anmie hmolytique). Vous mangez trop d'aliments riches en purines. Parmi ces aliments, on trouve les abats et certains fruits de mer. L'alcool, en particulier la bire, est galement riche en purines. Une attaque de goutte peut tre dclenche par un traumatisme ou un stress. Quels sont les facteurs qui augmentent le risque ? Les facteurs suivants peuvent augmenter le risque de dvelopper cette affection : Avoir des antcdents familiaux de goutte. tre un homme d'ge mr. tre une femme mnopause. Prendre certains mdicaments,  notamment l'aspirine, la cyclosporine, les diurtiques, la lvodopa et la niacine. Avoir subi une transplantation d'organe. Souffrir de certaines affections, telles que : L'obsit. Un empoisonnement au plomb. Une maladie rnale. Une maladie de la peau appele  psoriasis . Il existe galement d'autres facteurs tels que : Une perte de poids trop rapide. Une dshydratation. Une consommation frquente [employer], en particulier de bire. Une consommation frquente de boissons contenant un type de sucre appel  fructose . Quels sont les signes ou symptmes ? Une attaque de goutte aigu se produit rapidement. Elle se produit habituellement dans une seule articulation. L'endroit le plus frquent est le gros orteil. Les attaques CIG[employer]. Les articulations des pieds, de la [place], du genou, des doigts, du poignet ou du [place] tre touches. Les symptmes de cette affection comprennent : De fortes douleurs. Une sensation de Librarian, academic. Un gonflement. Une raideur. Une sensibilit. L'articulation affecte peut tre trs douloureuse au toucher. Une peau brillante, rouge ou violette. Des frissons accompagns de fivre. La goutte chronique peut engendrer des symptmes plus frquemment. Davantage d'articulations peuvent tre impliques. Des nodules blancs ou jaunes (tophus) peuvent apparatre sur les mains ou les pieds ou dans d'autres endroits prs des articulations. Comment se fait le diagnostic ? Le diagnostic de cette affection repose sur les symptmes, les antcdents mdicaux et un examen physique. Vous devrez peut-tre Optometrist, tels que : Des tests sanguins permettant de Designer, television/film set taux d'acide urique. Un prlvement de fluide articulaire  l'aide d'une aiguille fine (aspiration) pour [employer] cristaux d'acide urique. Des radiographies pour dceler des lsions articulaires. Comment cette affection est-elle traite ? Le traitement de cette  affection comporte deux phases : le traitement d'une crise aigu et la prvention d'attaques futures. Le traitement de la goutte aigu peut inclure des [employer]  de rduire les douleurs et [employer]el, notamment : Des anti-inflammatoires non strodiens (AINS), comme l'ibuprofne. Des strodes. Ce sont des anti-inflammatoires puissants qui peuvent tre pris par la bouche (voie orale) ou injects Humana Inc articulation. De la colchicine. Ce mdicament soulage les douleurs et les gonflements lorsqu'il est pris peu de temps aprs une attaque. Il peut tre administr par Genworth Financial ou par [employer]. Le traitement prventif peut comprendre : L'utilisation quotidienne de doses plus faibles d'AINS ou de colchicine. La prise d'un mdicament permettant de rduire les taux d'acide urique Lennar Corporation sang, par exemple, l'allopurinol. Des modifications des [employer]. Vous devrez peut-tre consulter un ditticien pour savoir ce qu'il convient de manger et boire afin de prvenir les crises de goutte. Suivez les instructions suivantes  domicile : Sales promotion account executive crise de goutte  Si le prestataire de soins de sant vous le recommande, appliquez de la glace sur la zone [place]. Pour ce faire : Colman Cater de la glace dans un sac en plastique. Placez une serviette entre votre peau et le sac. Laissez la glace en place pendant 20 minutes, 2  3 fois par jour. Retirez la glace si votre peau devient rouge vif. Cela est trs important. Si vous ne ressentez Management consultant, la chaleur ou le froid, vous pourriez endommager votre peau plus facilement. Soulevez (levez) l'articulation touche au-dessus du niveau de votre cour aussi souvent que possible. Veillez  reposer l'articulation le plus possible. Si l'articulation touche se situe au niveau de la [place], il vous faudra peut-tre Fisher Scientific. Suivez les instructions de Landscape architect de soins de sant en ce qui concerne les  restrictions relatives  ce que vous pouvez manger ou boire. Pour viter les crises de goutte futures Suivez un rgime  Doctor, general practice en purine conformment Forensic psychologist de votre ditticien ou votre prestataire de soins de sant. vitez les aliments et les boissons riches en purines, y [employer] Data Systems, les reins, les anchois, les asperges, [employer], Safeway Inc, les [employer] Insurance [employer] Annu[employer] et la bire. Maintenez un poids sant ou perdez du poids si vous tes en surpoids. Si vous dsirez perdre du poids, parlez-en  votre prestataire de soins de sant. Severiano Gilbert  ne pas perdre de poids trop rapidement. Commencez ou [employer]in un programme d'exercice physique tel que recommand par votre prestataire de soins de sant. Alimentation et boissons vitez de consommer des boissons contenant du fructose. Buvez des quantits suffisantes de liquides pour garder votre urine jaune ple. Si vous consommez de l'alcool : Armed forces logistics/support/administrative officer consommation  : 1 verre par jour pour [employer] femmes qui ne sont pas enceintes. 2 verres par Baxter International. Sachez quelle quantit d'alcool est contenue dans un verre. Aux .-U., un verre correspond  une bouteille de bire (355 ml [12 onces]),  un verre de vin (148 ml [5 onces]) ou  un verre  liqueur d'alcool fort (44 ml [1,5 once]). Instructions gnrales Prenez vos mdicaments en vente libre et sur ordonnance en suivant scrupuleusement les instructions de votre prestataire de soins de sant. Demandez  votre prestataire de soins de sant si vous pouvez conduire ou utiliser des machines lorsque vous prenez le mdicament qu'il vous a prescrit. Reprenez vos activits habituelles en veillant  suivre les recommandations de votre prestataire de soins de sant. Demandez  votre prestataire de soins de sant quelles activits sont sans danger pour vous. Rendez-vous  toutes les visites de suivi. C'est important. Pour plus d'informations [place] ([employer]  de la sant) :  [email or web address]/ Prenez Sales promotion account executive de soins de sant si vous avez : [place] crise de goutte. Des symptmes d'une crise de goutte qui persistent aprs 10 jours de Agricultural consultant. Des effets secondaires lis  la prise de vos mdicaments. Des frissons ou de la fivre. Une sensation de brlure lorsque vous urinez. Une [employer]a Inc bas du dos ou  l'abdomen. Vous devez consulter immdiatement si vous : Avez de fortes ou incontrlables douleurs. N'arrivez pas  uriner. Rsum La goutte est un gonflement douloureux des articulations caus par un excs d'acide urique dans l'organisme. La crise de goutte se Tour manager plus souvent au niveau du gros orteil, mais d'autres TEFL teacher tre touches. Les mdicaments et [employer] modification des [employer] aider  prvenir et  traiter les crises de goutte. Ces conseils et renseignements ne sauraient se substituer  l'avis mdical de votre prestataire de soins de sant. Par consquent, il est primordial de parler de toutes vos proccupations avec votre prestataire de soins de sant. Document Revised: 10/29/2020 Document Reviewed: 10/29/2020 Elsevier Patient Education  2023 Elsevier Inc. Low-Purine Eating Plan A low-purine eating plan involves making food choices to limit your purine intake. Purine is a kind of uric acid. Too much uric acid in your blood can cause certain conditions, such as gout and kidney stones. Eating a low-purine diet may help control these conditions. What are tips for following this plan? Shopping Avoid buying products that contain high-fructose corn syrup. Check for this on food labels. It is commonly found in many processed foods and soft drinks. Be sure to check for it in baked goods such as cookies, canned fruits, and cereals and cereal bars. Avoid buying veal, chicken breast with skin, lamb, and organ meats such as liver. These types of meats tend to have the  highest purine content. Choose dairy products. These may lower uric acid levels. Avoid certain types of fish. Not all fish and seafood have high purine content. Examples with high purine content include anchovies, trout, tuna, sardines, and salmon. Avoid buying beverages that contain alcohol, particularly beer and hard liquor. Alcohol can affect the way your body gets rid of uric acid. Meal planning  Learn which foods do or do not affect you. If you find out that a food tends to cause your gout symptoms to flare up, avoid eating that food. You can enjoy foods that do not cause problems. If you have any questions about a food item, talk with your dietitian or health care provider. Reduce the overall amount of meat in your diet. When you do eat meat, choose ones with lower purine content. Include plenty of fruits and vegetables. Although some vegetables may have a high purine content--such as asparagus, mushrooms, spinach, or cauliflower--it has been shown that these do not contribute to uric acid blood levels as much. Consume at least 1 dairy serving a day. This has been shown to decrease uric acid levels. General information If you drink alcohol: Limit how much you have to: 0-1 drink a day for women who are not pregnant. 0-2 drinks a day for men. Know how much alcohol is in a drink. In the [place] one drink equals one 12 oz bottle of beer (355 mL), one 5 oz glass of wine (148 mL), or one 1 oz glass of hard liquor (44 mL). Drink plenty of water. Try to drink enough to keep your urine pale yellow. Fluids can help remove uric acid from your body. Work with your health  care provider and dietitian to develop a plan to achieve or maintain a healthy weight. Losing weight may help reduce uric acid in your blood. What foods are recommended? The following are some types of foods that are good choices when limiting purine intake: Fresh or frozen fruits and vegetables. Whole grains, breads, cereals, and  pasta. Rice. Beans, peas, legumes. Nuts and seeds. Dairy products. Fats and oils. The items listed above may not be a complete list. Talk with a dietitian about what dietary choices are best for you. What foods are not recommended? Limit your intake of foods high in purines, including: Beer and other alcohol. Meat-based gravy or sauce. Canned or fresh fish, such as: Anchovies, sardines, herring, salmon, and tuna. Mussels and scallops. Codfish, trout, and haddock. Bacon, veal, chicken breast with skin, and lamb. Organ meats, such as: Liver or kidney. Tripe. Sweetbreads (thymus gland or pancreas). Wild Education officer, environmental. Yeast or yeast extract supplements. Drinks sweetened with high-fructose corn syrup, such as soda. Processed foods made with high-fructose corn syrup. The items listed above may not be a complete list of foods and beverages you should limit. Contact a dietitian for more information. Summary Eating a low-purine diet may help control conditions caused by too much uric acid in the body, such as gout or kidney stones. Choose low-purine foods, limit alcohol, and limit high-fructose corn syrup. You will learn over time which foods do or do not affect you. If you find out that a food tends to cause your gout symptoms to flare up, avoid eating that food. This information is not intended to replace advice given to you by your health care provider. Make sure you discuss any questions you have with your health care provider. Document Revised: 12/23/2020 Document Reviewed: 12/23/2020 Elsevier Patient Education  2023 [employer].
